# Patient Record
Sex: Female | Born: 1970 | Race: White | Hispanic: No | Smoking: Never smoker
Health system: Southern US, Community
[De-identification: ages and names within clinical notes are randomized; demographics above are authoritative.]

## PROBLEM LIST (undated history)

## (undated) DIAGNOSIS — G43909 Migraine, unspecified, not intractable, without status migrainosus: Secondary | ICD-10-CM

## (undated) DIAGNOSIS — N200 Calculus of kidney: Secondary | ICD-10-CM

## (undated) HISTORY — DX: Migraine, unspecified, not intractable, without status migrainosus: G43.909

## (undated) HISTORY — DX: Calculus of kidney: N20.0

---

## 1999-08-12 ENCOUNTER — Other Ambulatory Visit: Admission: RE | Admit: 1999-08-12 | Discharge: 1999-08-12 | Payer: Self-pay | Admitting: Obstetrics and Gynecology

## 2000-08-17 ENCOUNTER — Other Ambulatory Visit: Admission: RE | Admit: 2000-08-17 | Discharge: 2000-08-17 | Payer: Self-pay | Admitting: Obstetrics and Gynecology

## 2000-12-18 ENCOUNTER — Emergency Department (HOSPITAL_COMMUNITY): Admission: EM | Admit: 2000-12-18 | Discharge: 2000-12-19 | Payer: Self-pay | Admitting: *Deleted

## 2000-12-19 ENCOUNTER — Encounter: Payer: Self-pay | Admitting: *Deleted

## 2000-12-19 ENCOUNTER — Ambulatory Visit (HOSPITAL_COMMUNITY): Admission: RE | Admit: 2000-12-19 | Discharge: 2000-12-19 | Payer: Self-pay | Admitting: *Deleted

## 2001-08-29 ENCOUNTER — Other Ambulatory Visit: Admission: RE | Admit: 2001-08-29 | Discharge: 2001-08-29 | Payer: Self-pay | Admitting: *Deleted

## 2002-09-10 ENCOUNTER — Other Ambulatory Visit: Admission: RE | Admit: 2002-09-10 | Discharge: 2002-09-10 | Payer: Self-pay | Admitting: Obstetrics and Gynecology

## 2003-10-31 ENCOUNTER — Inpatient Hospital Stay (HOSPITAL_COMMUNITY): Admission: AD | Admit: 2003-10-31 | Discharge: 2003-11-02 | Payer: Self-pay | Admitting: Obstetrics and Gynecology

## 2003-12-17 ENCOUNTER — Other Ambulatory Visit: Admission: RE | Admit: 2003-12-17 | Discharge: 2003-12-17 | Payer: Self-pay | Admitting: Obstetrics and Gynecology

## 2004-12-06 ENCOUNTER — Ambulatory Visit: Payer: Self-pay | Admitting: Family Medicine

## 2004-12-14 ENCOUNTER — Ambulatory Visit: Payer: Self-pay | Admitting: Family Medicine

## 2004-12-16 ENCOUNTER — Encounter: Admission: RE | Admit: 2004-12-16 | Discharge: 2004-12-16 | Payer: Self-pay | Admitting: Family Medicine

## 2005-02-01 ENCOUNTER — Other Ambulatory Visit: Admission: RE | Admit: 2005-02-01 | Discharge: 2005-02-01 | Payer: Self-pay | Admitting: Obstetrics and Gynecology

## 2005-05-05 ENCOUNTER — Ambulatory Visit: Payer: Self-pay | Admitting: Internal Medicine

## 2006-05-30 ENCOUNTER — Ambulatory Visit: Payer: Self-pay | Admitting: Family Medicine

## 2006-06-26 ENCOUNTER — Ambulatory Visit: Payer: Self-pay | Admitting: Family Medicine

## 2006-07-24 ENCOUNTER — Ambulatory Visit: Payer: Self-pay | Admitting: Family Medicine

## 2006-08-03 ENCOUNTER — Ambulatory Visit: Payer: Self-pay | Admitting: Family Medicine

## 2006-12-17 ENCOUNTER — Telehealth (INDEPENDENT_AMBULATORY_CARE_PROVIDER_SITE_OTHER): Payer: Self-pay | Admitting: *Deleted

## 2007-01-29 ENCOUNTER — Encounter: Payer: Self-pay | Admitting: Family Medicine

## 2007-02-01 ENCOUNTER — Ambulatory Visit: Payer: Self-pay | Admitting: Family Medicine

## 2007-02-01 ENCOUNTER — Ambulatory Visit: Payer: Self-pay | Admitting: Cardiovascular Disease

## 2007-02-01 ENCOUNTER — Telehealth: Payer: Self-pay | Admitting: Family Medicine

## 2007-02-01 DIAGNOSIS — G43009 Migraine without aura, not intractable, without status migrainosus: Secondary | ICD-10-CM | POA: Insufficient documentation

## 2007-02-01 DIAGNOSIS — L659 Nonscarring hair loss, unspecified: Secondary | ICD-10-CM | POA: Insufficient documentation

## 2007-02-01 IMAGING — CT CT HEAD W/O CM
1 series · 16 of 30 positions shown, 20 images · IV contrast (agent unspecified)
Comparison: MRI [DATE]

CLINICAL DATA: Headache
TECHNIQUE: 5mm collimated images were obtained from the base of the skull
through the vertex according to standard protocol without contrast.

HEAD CT WITHOUT CONTRAST:

[Series 2: head_seq 4.5 h37s st · axial · 0.43mm/px · z∈[-135,-9]mm · 16 of 32 slices shown, 20 images]
[im 2/32  brain]
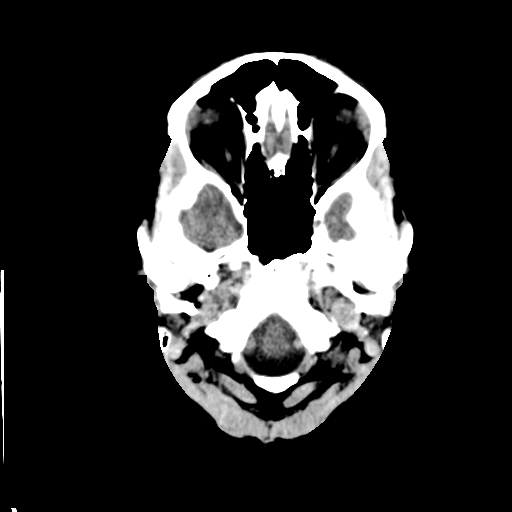
[im 2/32  bone]
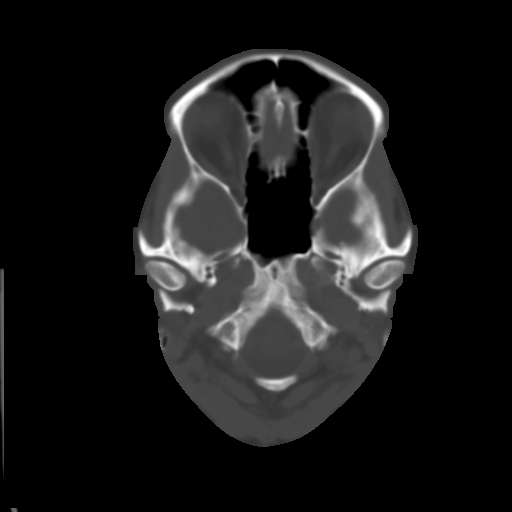
[im 4/32  brain]
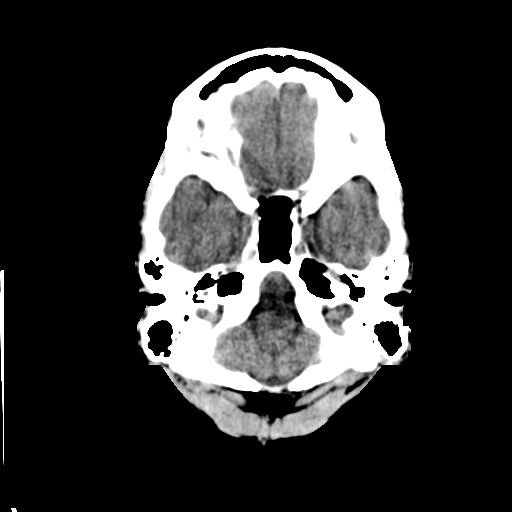
[im 6/32  brain]
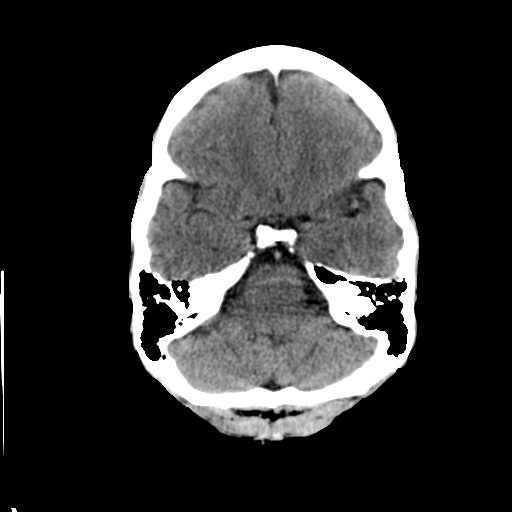
[im 8/32  brain]
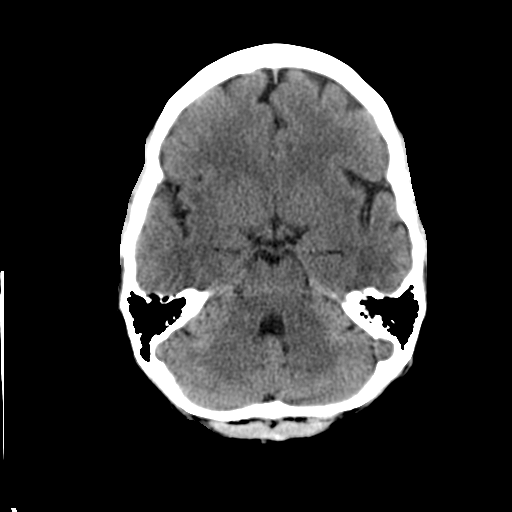
[im 9/32  brain]
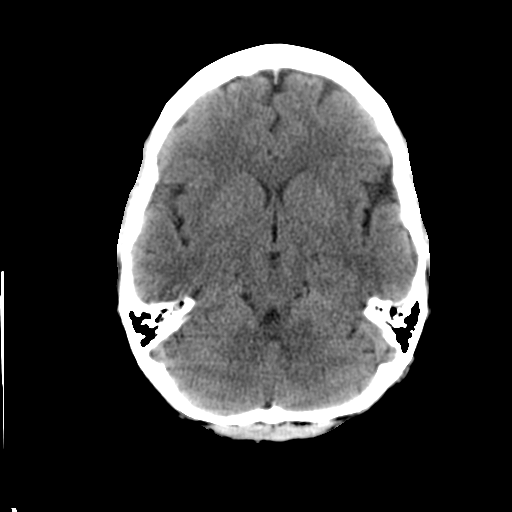
[im 9/32  bone]
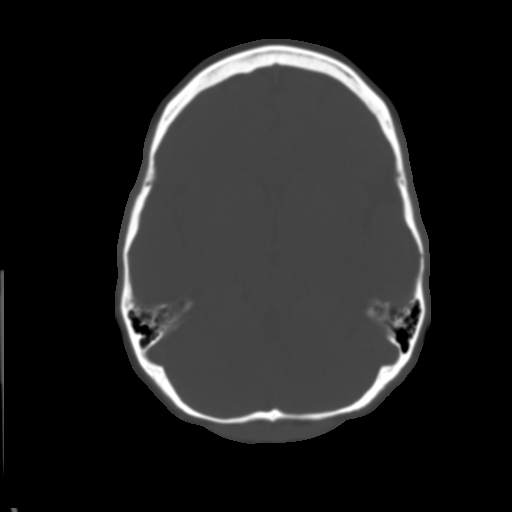
[im 11/32  brain]
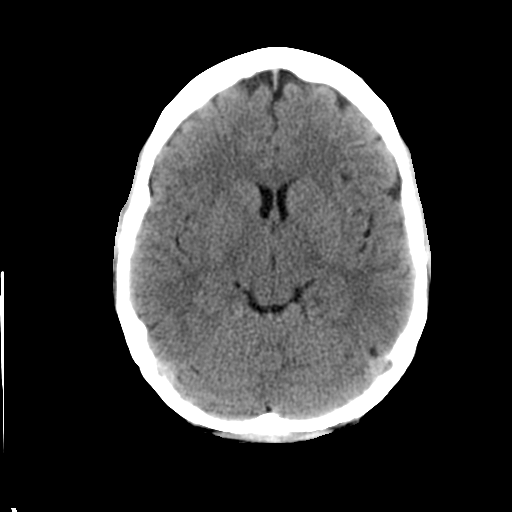
[im 13/32  brain]
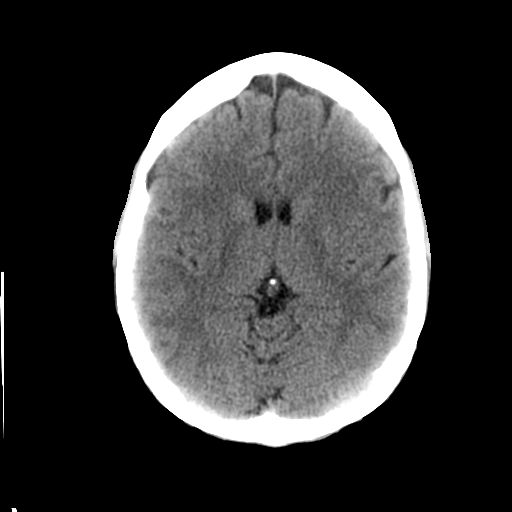
[im 15/32  brain]
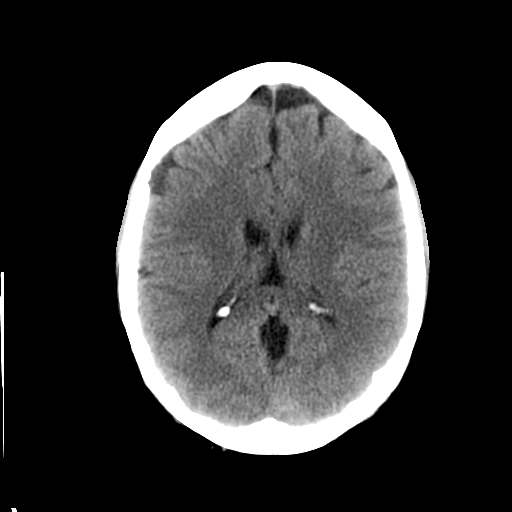
[im 17/32  brain]
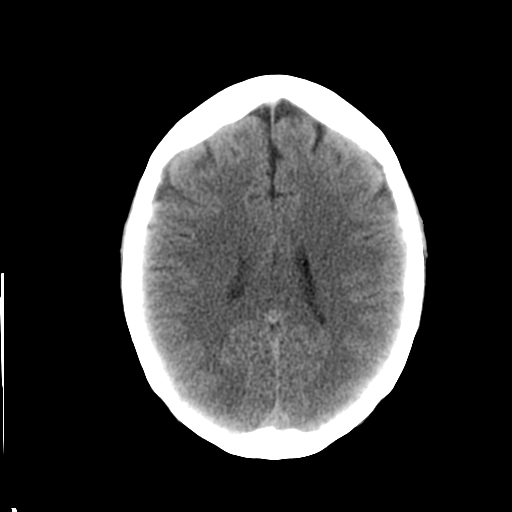
[im 17/32  bone]
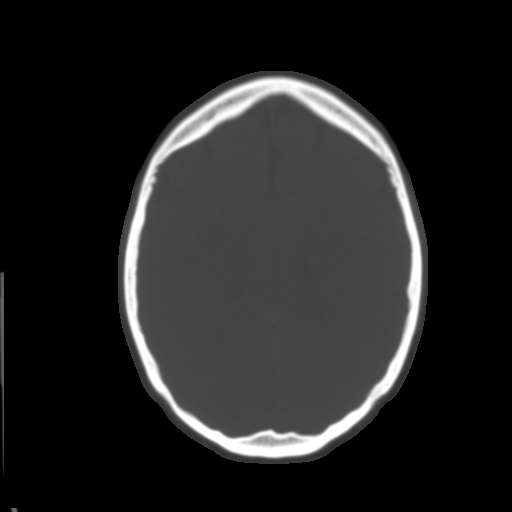
[im 19/32  brain]
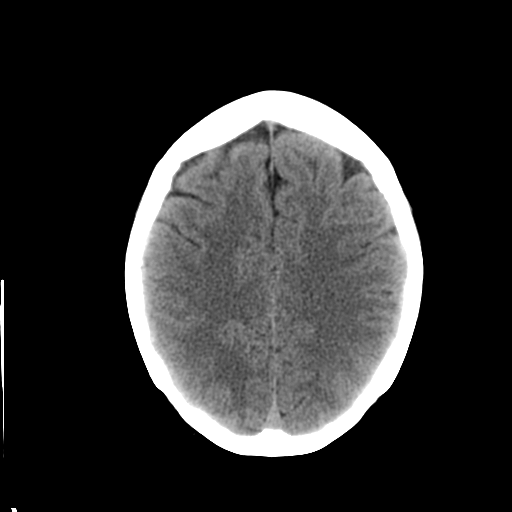
[im 21/32  brain]
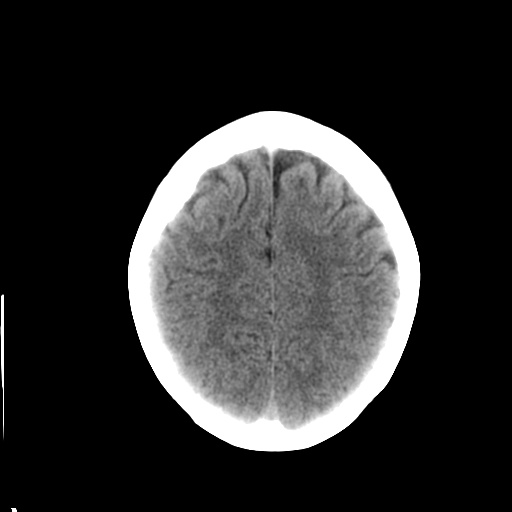
[im 23/32  brain]
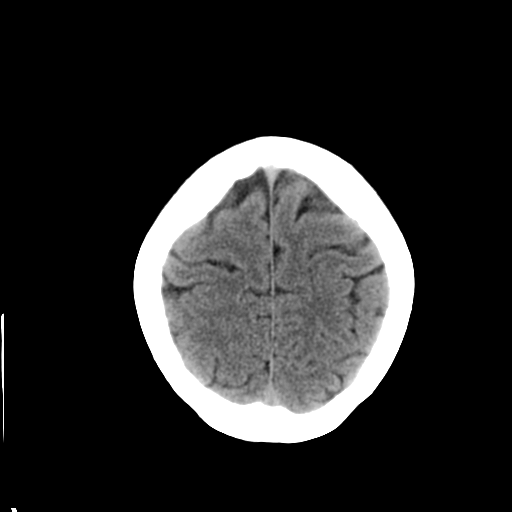
[im 24/32  brain]
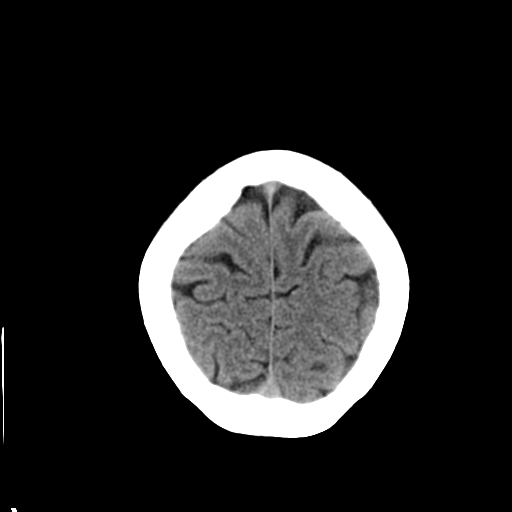
[im 24/32  bone]
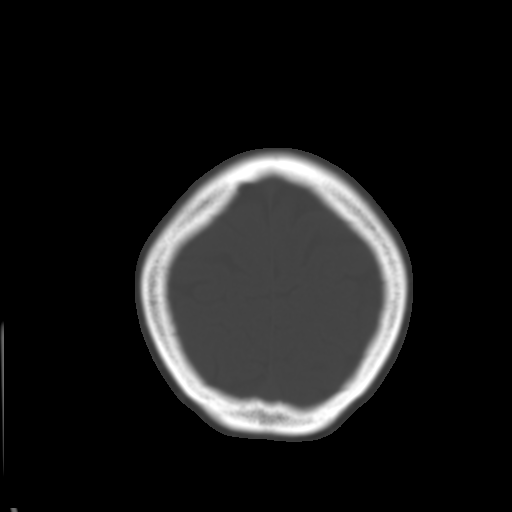
[im 26/32  brain]
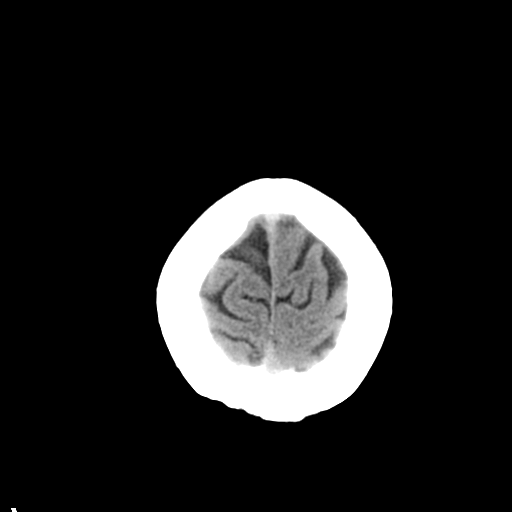
[im 28/32  brain]
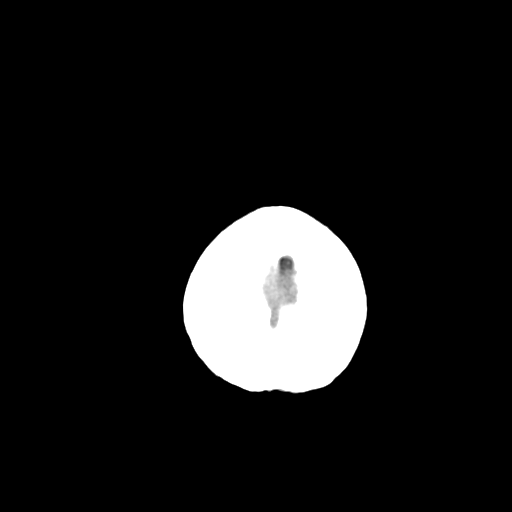
[im 30/32  brain]
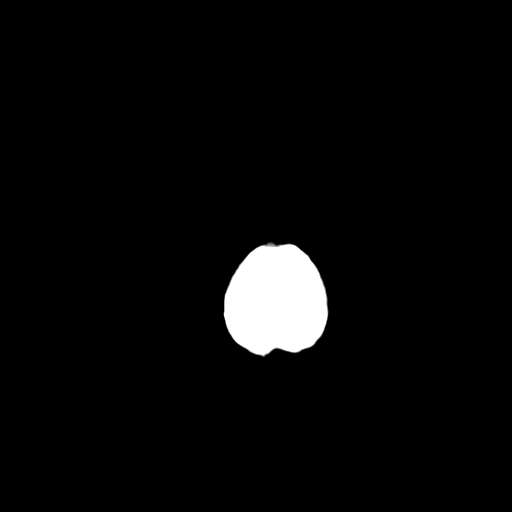

[16 of 30 positions shown; findings below may reference images not displayed]

FINDINGS: There is no evidence for acute hemorrhage, hydrocephalus, mass/mass-effect or
abnormal extra-axial fluid collection.  No definite CT evidence for acute
ischemia.  The visualized paranasal sinuses are clear
IMPRESSION: No acute intracranial abnormality.

## 2007-02-04 LAB — CONVERTED CEMR LAB
BUN: 7 mg/dL (ref 6–23)
CO2: 28 meq/L (ref 19–32)
Calcium: 9.3 mg/dL (ref 8.4–10.5)
Chloride: 103 meq/L (ref 96–112)
Creatinine, Ser: 0.7 mg/dL (ref 0.4–1.2)
Folate: 10.1 ng/mL
GFR calc Af Amer: 122 mL/min
GFR calc non Af Amer: 101 mL/min
Glucose, Bld: 83 mg/dL (ref 70–99)
Potassium: 4.1 meq/L (ref 3.5–5.1)
Sed Rate: 14 mm/hr (ref 0–25)
Sodium: 138 meq/L (ref 135–145)
TSH: 1.58 microintl units/mL (ref 0.35–5.50)
Vitamin B-12: 208 pg/mL — ABNORMAL LOW (ref 211–911)

## 2007-02-12 ENCOUNTER — Encounter (INDEPENDENT_AMBULATORY_CARE_PROVIDER_SITE_OTHER): Payer: Self-pay | Admitting: *Deleted

## 2007-02-19 ENCOUNTER — Encounter: Payer: Self-pay | Admitting: Family Medicine

## 2007-09-09 ENCOUNTER — Telehealth (INDEPENDENT_AMBULATORY_CARE_PROVIDER_SITE_OTHER): Payer: Self-pay | Admitting: *Deleted

## 2009-11-01 ENCOUNTER — Ambulatory Visit: Payer: Self-pay | Admitting: Family Medicine

## 2009-11-01 DIAGNOSIS — N760 Acute vaginitis: Secondary | ICD-10-CM | POA: Insufficient documentation

## 2009-11-01 DIAGNOSIS — N898 Other specified noninflammatory disorders of vagina: Secondary | ICD-10-CM | POA: Insufficient documentation

## 2009-11-01 LAB — CONVERTED CEMR LAB
KOH Prep: NEGATIVE
Whiff Test: POSITIVE

## 2009-11-02 ENCOUNTER — Telehealth (INDEPENDENT_AMBULATORY_CARE_PROVIDER_SITE_OTHER): Payer: Self-pay | Admitting: *Deleted

## 2009-11-02 LAB — CONVERTED CEMR LAB
Chlamydia, DNA Probe: NEGATIVE
GC Probe Amp, Genital: NEGATIVE

## 2010-04-06 ENCOUNTER — Telehealth (INDEPENDENT_AMBULATORY_CARE_PROVIDER_SITE_OTHER): Payer: Self-pay | Admitting: *Deleted

## 2010-04-07 ENCOUNTER — Ambulatory Visit: Payer: Self-pay | Admitting: Family Medicine

## 2010-04-07 DIAGNOSIS — IMO0002 Reserved for concepts with insufficient information to code with codable children: Secondary | ICD-10-CM | POA: Insufficient documentation

## 2010-07-12 NOTE — Progress Notes (Signed)
Summary: lab results  Phone Note Outgoing Call   Call placed by: Army Fossa CMA,  Nov 02, 2009 12:03 PM Reason for Call: Discuss lab or test results Summary of Call: Regarding lab results, LMTCB:  chalymdia,gc: normal  Follow-up for Phone Call        left message to call back. Army Fossa CMA  Nov 03, 2009 10:24 AM  DISCUSS WITH PATIENT....................Marland KitchenFelecia Deloach CMA  Nov 03, 2009 10:30 AM

## 2010-07-12 NOTE — Progress Notes (Signed)
Summary: bug bite? refuse treatment ED/UC  Phone Note Call from Patient Call back at [phone]   Caller: Patient Summary of Call: Patient was pulling weeds last night and noticed a "bug bite"---today it is getting larger, more swollen, very red, itchy---size of half dollar--she says it is on radial side of her right hand near crease at wrist  She says as day progressed, you can see a hole in middle of bite "like a chuck was taken out"  please call [phone] Initial call taken by: Jerolyn Shin,  April 06, 2010 4:27 PM  Follow-up for Phone Call        called pt back, pt states that she is unsure what bite her but bite is increase in size, red and itchy. Advise pt she needs to be seen in ED/UC. Pt insist that she be seen today, advise pt that no appt were available after checking with nurse. Offer pt appt for tomorrow pt refuse stating that she needed to be seen today again advise pt to UC. Pt to call in AM she states if she does not go some where today to be seen. Pt decline to schedule appt at this time. Inform pt that she can try benadryl to help with the symptoms.................Marland KitchenFelecia Deloach CMA  April 06, 2010 4:49 PM   Additional Follow-up for Phone Call Additional follow up Details #1::        please call and check on pt -- we can see her today if she didn't go anywhere Additional Follow-up by: Loreen Freud DO,  April 07, 2010 8:28 AM    Additional Follow-up for Phone Call Additional follow up Details #2::    Left message to call back..... Almeta Monas CMA Duncan Dull)  April 07, 2010 8:47 AM Pt return call stating that she was unable to go to ED/UC on yesterday but would like a appt for today. appt scheduled............Marland KitchenFelecia Deloach CMA  April 07, 2010 12:44 PM

## 2010-07-12 NOTE — Assessment & Plan Note (Signed)
Summary: bug bite//fd   Vital Signs:  Patient profile:   40 year old female Weight:      116 pounds Temp:     98.7 degrees F oral BP sitting:   102 / 60  (left arm)  Vitals Entered By: Doristine Devoid CMA (April 07, 2010 4:27 PM) CC: bug bite? on R wrist xtues. now oozing and redness    History of Present Illness: 40 yo woman here today for bug bite on R wrist.  1st appeared on Tues.  yesterday was small and itchy.  today is large, red, warm.  still very itchy.  now has clear blisters in the center w/ 'oozing'.  no fevers.  redness is spreading- line drawn yesterday afternoon.  took benadryl last night.  was pulling weeds Tuesday.  also has rose bushes.  Allergies (verified): 1)  ! Stadol  Review of Systems      See HPI  Physical Exam  General:  Well-developed,well-nourished,in no acute distress; alert,appropriate and cooperative throughout examination Skin:  R wrist w/ 5 cm area of erythema, mildly warm, no induration w/ central area w/ vesicles, some oozing of clear fluid.  not painful.  + itchy.  similar smaller area higher on forearm   Impression & Recommendations:  Problem # 1:  CELLULITIS/ABSCESS, ARM (ICD-682.3) Assessment New pt w/ apparent cellulitis on the wrist.  ? whether this is actually poison ivy rather than bug bite given multiple areas and presence of vesicles.  start keflex for infxn and steroid cream for itching.  reviewed supportive care and red flags that should prompt return.  Pt expresses understanding and is in agreement w/ this plan. Her updated medication list for this problem includes:    Cephalexin 500 Mg Tabs (Cephalexin) .Marland Kitchen... Take one by mouth 2 times daily x7 days  Complete Medication List: 1)  Seasonale 0.15-0.03 Mg Tabs (Levonorgest-eth estrad 91-day) 2)  Cephalexin 500 Mg Tabs (Cephalexin) .... Take one by mouth 2 times daily x7 days 3)  Triamcinolone Acetonide 0.1 % Oint (Triamcinolone acetonide) .... Apply to affected area two times a  day.  disp 1 large tube  Patient Instructions: 1)  Take the Cephalexin as directed for the cellulitis (infection) 2)  Use the triamcinolone cream two times a day for itching 3)  Continue the benadryl as needed 4)  Wash your sheets and towels and anything that may have come in contact w/ the poison ivy 5)  If the redness continues to spread or you have other concerns- call! 6)  Hang in there!! Prescriptions: TRIAMCINOLONE ACETONIDE 0.1 % OINT (TRIAMCINOLONE ACETONIDE) apply to affected area two times a day.  disp 1 large tube  #1 x 1   Entered and Authorized by:   Neena Rhymes MD   Signed by:   Neena Rhymes MD on 04/07/2010   Method used:   Electronically to        [facility] # [phone]* (retail)       [address] [place], [place]  [address]       Ph: [phone]       Fax: [phone]   RxID:   [id number] CEPHALEXIN 500 MG  TABS (CEPHALEXIN) take one by mouth 2 times daily x7 days  #14 x 0   Entered and Authorized by:   Neena Rhymes MD   Signed by:   Neena Rhymes MD on 04/07/2010   Method used:  Electronically to        [facility] # [id number]* (retail)       [address] [place], [place]  [address]       Ph: [phone]       Fax: [phone]   RxID:   [id number]    Orders Added: 1)  Est. Patient Level III [phone]

## 2010-07-12 NOTE — Assessment & Plan Note (Signed)
Summary: vaginal odor & discharge.cbs   Vital Signs:  Patient profile:   40 year old female Height:      62 inches Weight:      117 pounds BMI:     21.48 Pulse rate:   78 / minute Pulse rhythm:   regular BP sitting:   117 / 78  (left arm) Cuff size:   regular  Vitals Entered By: Army Fossa CMA (Nov 01, 2009 11:15 AM) CC: Pt here for vaginal dischage and odor. Pt used restroom before she came in. , Vaginal discharge   History of Present Illness:  Vaginal discharge      This is a 40 year old woman who presents with Vaginal discharge.  The symptoms began 5 days ago.  The patient complains of itching and vaginal burning, but denies burning on urination, frequency, urgency, fever, pelvic pain, and back pain.  The discharge is described as white and malodorous.  The patient denies the following symptoms: genital sores, unusual vaginal bleeding, painful intercourse, rash, myalgias, arthralgias, and headache.  Prior treatment has included no prior treatment.    Allergies: 1)  ! Stadol (Butorphanol Tartrate)  Physical Exam  General:  Well-developed,well-nourished,in no acute distress; alert,appropriate and cooperative throughout examination Abdomen:  Bowel sounds positive,abdomen soft and non-tender without masses, organomegaly or hernias noted. Genitalia:  normal introitus, no external lesions, no vaginal or cervical lesions, and vaginal discharge.   Psych:  Cognition and judgment appear intact. Alert and cooperative with normal attention span and concentration. No apparent delusions, illusions, hallucinations   Impression & Recommendations:  Problem # 1:  VAGINAL DISCHARGE (ICD-623.5)  Orders: T-Chlamydia Probe, genital (13086-57846) T-GC Probe, genital (96295-28413) Wet Prep (24401UU) Urine Pregnancy Test  (72536) UA Dipstick w/o Micro (manual) (64403)  Discussed medication use and symptom relief.   Problem # 2:  BACTERIAL VAGINITIS (ICD-616.10)  Her updated medication  list for this problem includes:    Flagyl 500 Mg Tabs (Metronidazole) .Marland Kitchen... 1 by mouth two times a day  Orders: Urine Pregnancy Test  (47425) UA Dipstick w/o Micro (manual) (95638)  Discussed symptomatic relief and treatment options.   Complete Medication List: 1)  Seasonale 0.15-0.03 Mg Tabs (Levonorgest-eth estrad 91-day) 2)  Flagyl 500 Mg Tabs (Metronidazole) .Marland Kitchen.. 1 by mouth two times a day Prescriptions: FLAGYL 500 MG TABS (METRONIDAZOLE) 1 by mouth two times a day  #14 x 0   Entered and Authorized by:   Loreen Freud DO   Signed by:   Loreen Freud DO on 11/01/2009   Method used:   Electronically to        [facility] # [id number]* (retail)       [address] [place], [place]  [address]       Ph: [phone]       Fax: [phone]   RxID:   [id number]   Laboratory Results  Date/Time Received: Nov 01, 2009 11:36 AM  Date/Time Reported: Nov 01, 2009 11:36 AM   [employer] Source: vaginal WBC/hpf: 5-10 Bacteria/hpf: 1+ Clue cells/hpf: few  Positive whiff Yeast/hpf: none Wet [place]: Negative Trichomonas/hpf: none

## 2010-11-25 ENCOUNTER — Ambulatory Visit (INDEPENDENT_AMBULATORY_CARE_PROVIDER_SITE_OTHER): Payer: BC Managed Care – PPO | Admitting: Internal Medicine

## 2010-11-25 ENCOUNTER — Encounter: Payer: Self-pay | Admitting: Internal Medicine

## 2010-11-25 VITALS — BP 110/70 | HR 64 | Temp 98.3°F | Wt 117.0 lb

## 2010-11-25 DIAGNOSIS — J069 Acute upper respiratory infection, unspecified: Secondary | ICD-10-CM

## 2010-11-25 DIAGNOSIS — J029 Acute pharyngitis, unspecified: Secondary | ICD-10-CM

## 2010-11-25 LAB — POCT RAPID STREP A (OFFICE): Rapid Strep A Screen: NEGATIVE

## 2010-11-25 MED ORDER — AMOXICILLIN 500 MG PO CAPS: 1000.0000 mg | ORAL_CAPSULE | Freq: Two times a day (BID) | ORAL | Status: AC

## 2010-11-25 NOTE — Patient Instructions (Signed)
Rest, fluids , tylenol For cough, take Mucinex DM twice a day as needed  Take the antibiotic (amoxicillin), only if no better in 3 -4 days  Call if no better in 1 week Call anytime if the symptoms are severe, you have high fever, short of breath,  more ear pain

## 2010-11-25 NOTE — Progress Notes (Signed)
  Subjective:    Patient ID: Lynn Olsen, female    DOB: [birth year], 40 y.o.   MRN: [id number]  HPI Symptoms started 3 days ago with sore throat, fever up to 100, achy, today she developed a right ear ache.  Past Medical History  Diagnosis Date  . Migraines   . Kidney stone     litotrip. 2008   No past surgical history on file.   Review of Systems Denies any rash Some cough without chest congestion. Sinuses are congested with some nasal bleeding or discharge No nausea or vomiting      Objective:   Physical Exam  Constitutional: She appears well-developed and well-nourished. No distress.  HENT:  Head: Normocephalic and atraumatic.  Right Ear: External ear normal.  Left Ear: External ear normal.       Percussion of the mastoid area normal bilaterally. Throat without redness or discharge, absent tonsils  Neck: Normal range of motion. Neck supple.       1 lymphadenopathy on the left, less than 1 cm, nontender  Cardiovascular: Normal rate, regular rhythm and normal heart sounds.   Pulmonary/Chest: Breath sounds normal. No respiratory distress. She has no wheezes. She has no rales.  Skin: She is not diaphoretic.          Assessment & Plan:

## 2010-11-25 NOTE — Assessment & Plan Note (Addendum)
Symptoms consistent with a URI, rapid strep test negative, she has right ear pain without evidence of infection on physical exam. Plan: See instructions

## 2011-12-06 ENCOUNTER — Encounter: Payer: Self-pay | Admitting: Family Medicine

## 2011-12-06 ENCOUNTER — Ambulatory Visit (INDEPENDENT_AMBULATORY_CARE_PROVIDER_SITE_OTHER): Payer: BC Managed Care – PPO | Admitting: Family Medicine

## 2011-12-06 ENCOUNTER — Telehealth: Payer: Self-pay

## 2011-12-06 VITALS — BP 100/74 | HR 77 | Temp 98.4°F | Ht 62.0 in | Wt 108.0 lb

## 2011-12-06 DIAGNOSIS — F411 Generalized anxiety disorder: Secondary | ICD-10-CM

## 2011-12-06 DIAGNOSIS — R079 Chest pain, unspecified: Secondary | ICD-10-CM

## 2011-12-06 DIAGNOSIS — F419 Anxiety disorder, unspecified: Secondary | ICD-10-CM

## 2011-12-06 LAB — CBC WITH DIFFERENTIAL/PLATELET
Eosinophils Relative: 0.7 % (ref 0.0–5.0)
Lymphocytes Relative: 18.8 % (ref 12.0–46.0)
Monocytes Relative: 4.7 % (ref 3.0–12.0)
Neutrophils Relative %: 75.1 % (ref 43.0–77.0)
Platelets: 216 10*3/uL (ref 150.0–400.0)
WBC: 6.8 10*3/uL (ref 4.5–10.5)

## 2011-12-06 LAB — TSH: TSH: 0.76 u[IU]/mL (ref 0.35–5.50)

## 2011-12-06 LAB — HEPATIC FUNCTION PANEL
ALT: 17 U/L (ref 0–35)
AST: 17 U/L (ref 0–37)
Albumin: 3.9 g/dL (ref 3.5–5.2)
Alkaline Phosphatase: 33 U/L — ABNORMAL LOW (ref 39–117)
Bilirubin, Direct: 0.1 mg/dL (ref 0.0–0.3)
Total Bilirubin: 0.3 mg/dL (ref 0.3–1.2)

## 2011-12-06 LAB — BASIC METABOLIC PANEL
BUN: 8 mg/dL (ref 6–23)
Creatinine, Ser: 0.6 mg/dL (ref 0.4–1.2)
GFR: 120.02 mL/min (ref >60.00)

## 2011-12-06 MED ORDER — ESCITALOPRAM OXALATE 10 MG PO TABS: ORAL_TABLET | ORAL | Status: DC

## 2011-12-06 MED ORDER — ALPRAZOLAM 0.25 MG PO TABS: 0.2500 mg | ORAL_TABLET | Freq: Three times a day (TID) | ORAL | Status: DC | PRN

## 2011-12-06 NOTE — Telephone Encounter (Signed)
Call from patient and she stated she had been having some chest pain and elevated BP since yesterday, No sob, no radaiting pain, no other symptoms but she feels a little anxious. OK to see the patient today. Patent has ben scheduled for 1130 but I advised her to come in now and we will work her in as the schedule permits. Patient is ok with that.     KP

## 2011-12-06 NOTE — Progress Notes (Signed)
  Subjective:    Lynn Olsen is a 41 y.o. female who presents for evaluation of chest pain. Onset was 1 day ago. Symptoms have worsened since that time. The patient describes the pain as heaviness, pressure and does not radiate. Patient rates pain as a 7/10 in intensity. Associated symptoms are: none. Aggravating factors are: emotional stress. Alleviating factors are: position change.-- when she lies down she thinks about things and pain is worse.  When she is distracted it goes away.   Patient's cardiac risk factors are: family history of premature cardiovascular disease. Patient's risk factors for DVT/PE: oral contraceptive use. Previous cardiac testing: electrocardiogram (ECG).  The following portions of the patient's history were reviewed and updated as appropriate: allergies, current medications, past family history, past medical history, past social history, past surgical history and problem list.  Review of Systems Pertinent items are noted in HPI.    Objective:    BP 100/74  Pulse 77  Temp 98.4 F (36.9 C) (Oral)  Ht 5\' 2"  (1.575 m)  Wt 108 lb (48.988 kg)  BMI 19.75 kg/m2  SpO2 97% General appearance: alert, cooperative, appears stated age and no distress Neck: no adenopathy, no carotid bruit, no JVD, supple, symmetrical, trachea midline and thyroid not enlarged, symmetric, no tenderness/mass/nodules Lungs: clear to auscultation bilaterally Heart: regular rate and rhythm, S1, S2 normal, no murmur, click, rub or gallop Extremities: extremities normal, atraumatic, no cyanosis or edema Psych--no depression, no anxiety  Cardiographics ECG: normal sinus rhythm, no blocks or conduction defects, no ischemic changes  Imaging Chest x-ray: not indicated    Assessment:    Chest pain, suspected etiology: anxiety, panic attack    Plan:    Patient history and exam consistent with non-cardiac cause of chest pain. Follow up with me in 1 month. lexapro and xanax  Labs drawn rto  sooner prn

## 2011-12-06 NOTE — Patient Instructions (Signed)

## 2011-12-06 NOTE — Telephone Encounter (Signed)
Caller: Lexine/Patient; PCP: Lelon Perla.; CB#: [phone]; ; ; Call regarding Chest Pain/Chest Discomfort;  Was in disconnect when got call. Called back x 2. Pt states she has talked with Dr. Layla Maw who advised her to come on the office. Pt states "oh, I'm fine" when asked if ok to drive. Advised pt to pull over and call 911 at anytime if emergent sxs arose on the way. Pt did not sound to be in any disrtress.

## 2011-12-21 ENCOUNTER — Telehealth: Payer: Self-pay | Admitting: Family Medicine

## 2011-12-21 MED ORDER — ALPRAZOLAM 0.5 MG PO TABS: 0.5000 mg | ORAL_TABLET | Freq: Three times a day (TID) | ORAL | Status: DC | PRN

## 2011-12-21 NOTE — Telephone Encounter (Signed)
Xanax 0.5 mg  Tid #60  --- but that is the strongest I will write

## 2011-12-21 NOTE — Telephone Encounter (Signed)
Patient called and stated xanax not doing what she thought it would. Pt. Mentioned she has a strong tolerance to medications & would like a stronger dose. If approved can you send to  [id number] - [place], [place] - [address] AT [facility] [address]  Patient call back# [phone]

## 2011-12-21 NOTE — Telephone Encounter (Signed)
discussed with patient and she voiced understanding. Rx faxed      KP 

## 2012-01-05 ENCOUNTER — Ambulatory Visit (INDEPENDENT_AMBULATORY_CARE_PROVIDER_SITE_OTHER): Payer: BC Managed Care – PPO | Admitting: Family Medicine

## 2012-01-05 ENCOUNTER — Encounter: Payer: Self-pay | Admitting: Family Medicine

## 2012-01-05 VITALS — BP 110/70 | HR 73 | Temp 97.1°F | Wt 109.8 lb

## 2012-01-05 DIAGNOSIS — F419 Anxiety disorder, unspecified: Secondary | ICD-10-CM

## 2012-01-05 DIAGNOSIS — F411 Generalized anxiety disorder: Secondary | ICD-10-CM

## 2012-01-05 NOTE — Progress Notes (Signed)
  Subjective:    Patient ID: Lynn Olsen, female    DOB: May 26, 1971, 41 y.o.   MRN: [id number]  HPI  Pt here to f/u anxiety. She has had no more chest pain and overall is doing well with medication.   No new complaints.  Review of Systems As above    Objective:   Physical Exam  Constitutional: She is oriented to person, place, and time. She appears well-developed and well-nourished.  Neurological: She is alert and oriented to person, place, and time.  Psychiatric: She has a normal mood and affect. Her behavior is normal. Judgment and thought content normal.          Assessment & Plan:

## 2012-01-05 NOTE — Assessment & Plan Note (Signed)
con't lexapro-- she thinks she may want to increase to 20 mg  She will call us if she wants [place] to change the dose Cont' xanax qhs prn

## 2012-01-05 NOTE — Patient Instructions (Signed)

## 2012-01-16 ENCOUNTER — Telehealth: Payer: Self-pay | Admitting: *Deleted

## 2012-01-16 NOTE — Telephone Encounter (Signed)
Remind her to be very careful with xanax---  She can decrease lexapro but she has only been on inc dose for a little over a week---right?  If lexapro is not helping we may need to try something different because the idea was to decrease xanax not increase,

## 2012-01-16 NOTE — Telephone Encounter (Signed)
Discussed with patient and she is having issues with Insomnia, feel like the increased dose of Lexapro made it worst.  she stated she will cut back on the Lexapro and see if she can sleep better on the 10 mg and she will call us back to let us know.  Patient wanted a refill on the xanax last filled 12/21/11 # 60. Please advise    KP

## 2012-01-16 NOTE — Telephone Encounter (Signed)
Pt left VM that she has increased dose of med but is still having issue with sleeping. Pt notes that she is taking lexapro 10mg  2 tabs at night but it has seem to make her sleeping worse. Pt would like to know if she should decrease back down to the 1 tab. Pt also indicated that the xanax is working and needs a refill.Please advise

## 2012-01-17 MED ORDER — ALPRAZOLAM 0.5 MG PO TABS: 0.5000 mg | ORAL_TABLET | Freq: Three times a day (TID) | ORAL | Status: AC | PRN

## 2012-01-17 NOTE — Telephone Encounter (Signed)
Ok to refill x 1  

## 2012-02-02 ENCOUNTER — Telehealth: Payer: Self-pay

## 2012-02-02 NOTE — Telephone Encounter (Signed)
msg left to call the office     KP 

## 2012-02-02 NOTE — Telephone Encounter (Signed)
Message left on triage voicemail: taking lexapro x 2 months, with-in the last couple of weeks patient noticed arms and legs achy (not sure if related). Patient describe achyness as (arthritis type pain). Patient reviewed package insert but did not see this noted as a side effect.   Dr.Lowne please advise

## 2012-02-02 NOTE — Telephone Encounter (Signed)
Its not a side effect that I know about or is listed on side effects.

## 2012-02-07 NOTE — Telephone Encounter (Signed)
msg left to call the office     KP 

## 2012-02-09 NOTE — Telephone Encounter (Signed)
Discussed with patient and she will stop the lexapro for a few days and will call back on Monday to see if there is any improvement.    KP

## 2012-03-21 ENCOUNTER — Ambulatory Visit (INDEPENDENT_AMBULATORY_CARE_PROVIDER_SITE_OTHER): Payer: BC Managed Care – PPO | Admitting: Family Medicine

## 2012-03-21 ENCOUNTER — Encounter: Payer: Self-pay | Admitting: Family Medicine

## 2012-03-21 VITALS — BP 110/76 | HR 74 | Temp 98.4°F | Wt 109.8 lb

## 2012-03-21 DIAGNOSIS — F411 Generalized anxiety disorder: Secondary | ICD-10-CM

## 2012-03-21 DIAGNOSIS — F419 Anxiety disorder, unspecified: Secondary | ICD-10-CM

## 2012-03-21 MED ORDER — FLUOXETINE HCL 20 MG PO TABS: 20.0000 mg | ORAL_TABLET | Freq: Every day | ORAL | Status: DC

## 2012-03-21 MED ORDER — ALPRAZOLAM 0.5 MG PO TABS: 0.5000 mg | ORAL_TABLET | Freq: Every day | ORAL | Status: DC

## 2012-03-21 NOTE — Patient Instructions (Addendum)

## 2012-03-21 NOTE — Progress Notes (Signed)
  Subjective:    Patient ID: Lynn Olsen, female    DOB: 03-02-1971, 41 y.o.   MRN: [id number]  HPI Pt here   Review of Systems     Objective:   Physical Exam        Assessment & Plan:   Subjective:     Lynn Olsen is a 41 y.o. female who presents for follow up of anxiety disorder and panic attacks. She has the following anxiety symptoms: difficulty concentrating, insomnia and panic attacks. Onset of symptoms was approximately several months ago. Symptoms have been gradually worsening since that time. She denies current suicidal and homicidal ideation. Family history significant for no psychiatric illness. Risk factors: negative life event separation from husband and death of father as well as putting mother in nusing home. Previous treatment includes Lexapro and Xanax. She complains of the following medication side effects: nervousness. The following portions of the patient's history were reviewed and updated as appropriate: allergies, current medications, past family history, past medical history, past social history, past surgical history and problem list.  Review of Systems Pertinent items are noted in HPI.    Objective:    BP 110/76  Pulse 74  Temp 98.4 F (36.9 C) (Oral)  Wt 109 lb 12.8 oz (49.805 kg)  SpO2 98% General appearance: alert, cooperative, appears stated age and no distress Lungs: clear to auscultation bilaterally Heart: S1, S2 normal Lymph nodes: Cervical, supraclavicular, and axillary nodes normal. Neurologic: Alert and oriented X 3, normal strength and tone. Normal symmetric reflexes. Normal coordination and gait    Assessment:    anxiety disorder and panic attacks. Possible organic contributing causes are: none.   Plan:    Medications: Prozac and Xanax. Labs: no labs indicated at this time. Recommended counseling. List of counselors provided. Recommended transfer to psychiatry for medical management. List of Psychiatrists  recommended. Handouts describing disease, natural history, and treatment were given to the patient. Instructed patient to contact office or on-call physician promptly should condition worsen or any new symptoms appear and provided on-call telephone numbers. IF THE PATIENT HAS ANY SUICIDAL OR HOMICIDAL IDEATIONS, CALL THE OFFICE, DISCUSS WITH A SUPPORT MEMBER, OR GO TO THE ER IMMEDIATELY. Patient was agreeable with this plan. Follow up: 1 month.

## 2012-04-18 ENCOUNTER — Other Ambulatory Visit: Payer: Self-pay | Admitting: Family Medicine

## 2012-04-19 NOTE — Telephone Encounter (Signed)
Spoke with the pharmacy and was made aware that that the patient has another standing Rx with refills. I will cancel this request and he will fill the other from 03/21/12.     KP

## 2012-07-05 ENCOUNTER — Encounter: Payer: Self-pay | Admitting: *Deleted

## 2012-07-05 ENCOUNTER — Ambulatory Visit (INDEPENDENT_AMBULATORY_CARE_PROVIDER_SITE_OTHER): Payer: BC Managed Care – PPO | Admitting: *Deleted

## 2012-07-05 DIAGNOSIS — Z23 Encounter for immunization: Secondary | ICD-10-CM

## 2012-07-20 ENCOUNTER — Other Ambulatory Visit: Payer: Self-pay | Admitting: Family Medicine

## 2012-10-13 ENCOUNTER — Other Ambulatory Visit: Payer: Self-pay | Admitting: Family Medicine

## 2012-10-14 NOTE — Telephone Encounter (Signed)
Last seen and filled 03/21/12 # 60 with 5 refills. Please advise     KP

## 2012-10-23 ENCOUNTER — Telehealth: Payer: Self-pay | Admitting: *Deleted

## 2012-10-23 MED ORDER — FLUOXETINE HCL 20 MG PO TABS: ORAL_TABLET | ORAL | Status: DC

## 2012-10-23 NOTE — Telephone Encounter (Signed)
last OV 03-21-12, last filled 07-20-12 #30 3

## 2012-10-23 NOTE — Telephone Encounter (Signed)
Ok to refill until October

## 2012-12-13 ENCOUNTER — Other Ambulatory Visit: Payer: Self-pay | Admitting: Family Medicine

## 2012-12-16 NOTE — Telephone Encounter (Signed)
Last seen 03/21/12 and filled 10/13/12 #60. Please advise    KP

## 2012-12-20 ENCOUNTER — Encounter: Payer: Self-pay | Admitting: Lab

## 2012-12-23 ENCOUNTER — Encounter: Payer: Self-pay | Admitting: Family Medicine

## 2012-12-23 ENCOUNTER — Ambulatory Visit (INDEPENDENT_AMBULATORY_CARE_PROVIDER_SITE_OTHER): Payer: BC Managed Care – PPO | Admitting: Family Medicine

## 2012-12-23 VITALS — BP 116/70 | HR 88 | Temp 98.6°F | Ht 62.0 in | Wt 117.2 lb

## 2012-12-23 DIAGNOSIS — F411 Generalized anxiety disorder: Secondary | ICD-10-CM

## 2012-12-23 DIAGNOSIS — Z0000 Encounter for general adult medical examination without abnormal findings: Secondary | ICD-10-CM

## 2012-12-23 DIAGNOSIS — N390 Urinary tract infection, site not specified: Secondary | ICD-10-CM

## 2012-12-23 LAB — CBC WITH DIFFERENTIAL/PLATELET
Basophils Absolute: 0.1 10*3/uL (ref 0.0–0.1)
HCT: 41.8 % (ref 36.0–46.0)
Hemoglobin: 13.8 g/dL (ref 12.0–15.0)
Lymphs Abs: 1.5 10*3/uL (ref 0.7–4.0)
MCV: 96.8 fl (ref 78.0–100.0)
Monocytes Relative: 6.6 % (ref 3.0–12.0)
Neutro Abs: 3.5 10*3/uL (ref 1.4–7.7)
RDW: 14.2 % (ref 11.5–14.6)

## 2012-12-23 LAB — BASIC METABOLIC PANEL
CO2: 26 mEq/L (ref 19–32)
Chloride: 104 mEq/L (ref 96–112)
GFR: 96.42 mL/min (ref >60.00)
Glucose, Bld: 85 mg/dL (ref 70–99)
Potassium: 4.4 mEq/L (ref 3.5–5.1)
Sodium: 136 mEq/L (ref 135–145)

## 2012-12-23 LAB — HEPATIC FUNCTION PANEL
ALT: 14 U/L (ref 0–35)
AST: 19 U/L (ref 0–37)
Albumin: 4 g/dL (ref 3.5–5.2)

## 2012-12-23 LAB — POCT URINALYSIS DIPSTICK
Bilirubin, UA: NEGATIVE
Glucose, UA: NEGATIVE
Spec Grav, UA: 1.02

## 2012-12-23 LAB — LIPID PANEL
Cholesterol: 209 mg/dL — ABNORMAL HIGH (ref 0–200)
VLDL: 17.6 mg/dL (ref 0.0–40.0)

## 2012-12-23 LAB — LDL CHOLESTEROL, DIRECT: Direct LDL: 137 mg/dL

## 2012-12-23 MED ORDER — FLUOXETINE HCL 40 MG PO CAPS: 40.0000 mg | ORAL_CAPSULE | Freq: Every day | ORAL | Status: DC

## 2012-12-23 NOTE — Progress Notes (Signed)
  Subjective:     Lynn Olsen is a 42 y.o. female and is here for a comprehensive physical exam. The patient reports no problems.  Pt would like labs to Dr Marcelle Overlie  History   Social History  . Marital Status: Married    Spouse Name: N/A    Number of Children: N/A  . Years of Education: N/A   Occupational History  . pt-- [employer]    Social History Main Topics  . Smoking status: Never Smoker   . Smokeless tobacco: Never Used  . Alcohol Use: Yes     Comment: socially   . Drug Use: No  . Sexually Active: Not Currently -- Female partner(s)   Other Topics Concern  . Not on file   Social History Narrative   Exercise-- 5x a week   Health Maintenance  Topic Date Due  . Mammogram  12/25/1989  . Tetanus/tdap  12/26/1990  . Influenza Vaccine  02/10/2013  . Pap Smear  11/25/2015    The following portions of the patient's history were reviewed and updated as appropriate: allergies, current medications, past family history, past medical history, past social history, past surgical history and problem list.  Review of Systems Review of Systems  Constitutional: Negative for activity change, appetite change and fatigue.  HENT: Negative for hearing loss, congestion, tinnitus and ear discharge.  dentist q70m Eyes: Negative for visual disturbance (see optho q1y -- vision corrected to 20/20 with contacts).  Respiratory: Negative for cough, chest tightness and shortness of breath.   Cardiovascular: Negative for chest pain, palpitations and leg swelling.  Gastrointestinal: Negative for abdominal pain, diarrhea, constipation and abdominal distention.  Genitourinary: Negative for urgency, frequency, decreased urine volume and difficulty urinating.  Musculoskeletal: Negative for back pain, arthralgias and gait problem.  Skin: Negative for color change, pallor and rash.  Neurological: Negative for dizziness, light-headedness, numbness and headaches.  Hematological: Negative for  adenopathy. Does not bruise/bleed easily.  Psychiatric/Behavioral: Negative for suicidal ideas, confusion, sleep disturbance, self-injury, dysphoric mood, decreased concentration and agitation.       Objective:    BP 116/70  Pulse 88  Temp(Src) 98.6 F (37 C) (Oral)  Ht 5\' 2"  (1.575 m)  Wt 117 lb 3.2 oz (53.162 kg)  BMI 21.43 kg/m2  SpO2 95% General appearance: alert, cooperative, appears stated age and no distress Head: Normocephalic, without obvious abnormality, atraumatic Eyes: conjunctivae/corneas clear. PERRL, EOM's intact. Fundi benign. Ears: normal TM's and external ear canals both ears Nose: Nares normal. Septum midline. Mucosa normal. No drainage or sinus tenderness. Throat: lips, mucosa, and tongue normal; teeth and gums normal Neck: no adenopathy, supple, symmetrical, trachea midline and thyroid not enlarged, symmetric, no tenderness/mass/nodules Back: symmetric, no curvature. ROM normal. No CVA tenderness. Lungs: clear to auscultation bilaterally Breasts: gyn Heart: regular rate and rhythm, S1, S2 normal, no murmur, click, rub or gallop Abdomen: soft, non-tender; bowel sounds normal; no masses,  no organomegaly Pelvic: deferred --gyn Extremities: extremities normal, atraumatic, no cyanosis or edema Pulses: 2+ and symmetric Skin: Skin color, texture, turgor normal. No rashes or lesions Lymph nodes: Cervical, supraclavicular, and axillary nodes normal. Neurologic: Alert and oriented X 3, normal strength and tone. Normal symmetric reflexes. Normal coordination and gait   psych-- no anxiety, no depression   Assessment:    Healthy female exam.     Plan:     ghm utd Check labs See After Visit Summary for Counseling Recommendations

## 2012-12-23 NOTE — Addendum Note (Signed)
Addended by: Silvio Pate D on: 12/23/2012 05:08 PM   Modules accepted: Orders

## 2012-12-23 NOTE — Patient Instructions (Signed)
Preventive Care for Adults, Female A healthy lifestyle and preventive care can promote health and wellness. Preventive health guidelines for women include the following key practices.  A routine yearly physical is a good way to check with your caregiver about your health and preventive screening. It is a chance to share any concerns and updates on your health, and to receive a thorough exam.  Visit your dentist for a routine exam and preventive care every 6 months. Brush your teeth twice a day and floss once a day. Good oral hygiene prevents tooth decay and gum disease.  The frequency of eye exams is based on your age, health, family medical history, use of contact lenses, and other factors. Follow your caregiver's recommendations for frequency of eye exams.  Eat a healthy diet. Foods like vegetables, fruits, whole grains, low-fat dairy products, and lean protein foods contain the nutrients you need without too many calories. Decrease your intake of foods high in solid fats, added sugars, and salt. Eat the right amount of calories for you.Get information about a proper diet from your caregiver, if necessary.  Regular physical exercise is one of the most important things you can do for your health. Most adults should get at least 150 minutes of moderate-intensity exercise (any activity that increases your heart rate and causes you to sweat) each week. In addition, most adults need muscle-strengthening exercises on 2 or more days a week.  Maintain a healthy weight. The body mass index (BMI) is a screening tool to identify possible weight problems. It provides an estimate of body fat based on height and weight. Your caregiver can help determine your BMI, and can help you achieve or maintain a healthy weight.For adults 20 years and older:  A BMI below 18.5 is considered underweight.  A BMI of 18.5 to 24.9 is normal.  A BMI of 25 to 29.9 is considered overweight.  A BMI of 30 and above is  considered obese.  Maintain normal blood lipids and cholesterol levels by exercising and minimizing your intake of saturated fat. Eat a balanced diet with plenty of fruit and vegetables. Blood tests for lipids and cholesterol should begin at age 20 and be repeated every 5 years. If your lipid or cholesterol levels are high, you are over 50, or you are at high risk for heart disease, you may need your cholesterol levels checked more frequently.Ongoing high lipid and cholesterol levels should be treated with medicines if diet and exercise are not effective.  If you smoke, find out from your caregiver how to quit. If you do not use tobacco, do not start.  If you are pregnant, do not drink alcohol. If you are breastfeeding, be very cautious about drinking alcohol. If you are not pregnant and choose to drink alcohol, do not exceed 1 drink per day. One drink is considered to be 12 ounces (355 mL) of beer, 5 ounces (148 mL) of wine, or 1.5 ounces (44 mL) of liquor.  Avoid use of street drugs. Do not share needles with anyone. Ask for help if you need support or instructions about stopping the use of drugs.  High blood pressure causes heart disease and increases the risk of stroke. Your blood pressure should be checked at least every 1 to 2 years. Ongoing high blood pressure should be treated with medicines if weight loss and exercise are not effective.  If you are 55 to 41 years old, ask your caregiver if you should take aspirin to prevent strokes.  Diabetes   screening involves taking a blood sample to check your fasting blood sugar level. This should be done once every 3 years, after age 45, if you are within normal weight and without risk factors for diabetes. Testing should be considered at a younger age or be carried out more frequently if you are overweight and have at least 1 risk factor for diabetes.  Breast cancer screening is essential preventive care for women. You should practice "breast  self-awareness." This means understanding the normal appearance and feel of your breasts and may include breast self-examination. Any changes detected, no matter how small, should be reported to a caregiver. Women in their 20s and 30s should have a clinical breast exam (CBE) by a caregiver as part of a regular health exam every 1 to 3 years. After age 40, women should have a CBE every year. Starting at age 40, women should consider having a mammography (breast X-ray test) every year. Women who have a family history of breast cancer should talk to their caregiver about genetic screening. Women at a high risk of breast cancer should talk to their caregivers about having magnetic resonance imaging (MRI) and a mammography every year.  The Pap test is a screening test for cervical cancer. A Pap test can show cell changes on the cervix that might become cervical cancer if left untreated. A Pap test is a procedure in which cells are obtained and examined from the lower end of the uterus (cervix).  Women should have a Pap test starting at age 21.  Between ages 21 and 29, Pap tests should be repeated every 2 years.  Beginning at age 30, you should have a Pap test every 3 years as long as the past 3 Pap tests have been normal.  Some women have medical problems that increase the chance of getting cervical cancer. Talk to your caregiver about these problems. It is especially important to talk to your caregiver if a new problem develops soon after your last Pap test. In these cases, your caregiver may recommend more frequent screening and Pap tests.  The above recommendations are the same for women who have or have not gotten the vaccine for human papillomavirus (HPV).  If you had a hysterectomy for a problem that was not cancer or a condition that could lead to cancer, then you no longer need Pap tests. Even if you no longer need a Pap test, a regular exam is a good idea to make sure no other problems are  starting.  If you are between ages 65 and 70, and you have had normal Pap tests going back 10 years, you no longer need Pap tests. Even if you no longer need a Pap test, a regular exam is a good idea to make sure no other problems are starting.  If you have had past treatment for cervical cancer or a condition that could lead to cancer, you need Pap tests and screening for cancer for at least 20 years after your treatment.  If Pap tests have been discontinued, risk factors (such as a new sexual partner) need to be reassessed to determine if screening should be resumed.  The HPV test is an additional test that may be used for cervical cancer screening. The HPV test looks for the virus that can cause the cell changes on the cervix. The cells collected during the Pap test can be tested for HPV. The HPV test could be used to screen women aged 30 years and older, and should   be used in women of any age who have unclear Pap test results. After the age of 30, women should have HPV testing at the same frequency as a Pap test.  Colorectal cancer can be detected and often prevented. Most routine colorectal cancer screening begins at the age of 50 and continues through age 75. However, your caregiver may recommend screening at an earlier age if you have risk factors for colon cancer. On a yearly basis, your caregiver may provide home test kits to check for hidden blood in the stool. Use of a small camera at the end of a tube, to directly examine the colon (sigmoidoscopy or colonoscopy), can detect the earliest forms of colorectal cancer. Talk to your caregiver about this at age 50, when routine screening begins. Direct examination of the colon should be repeated every 5 to 10 years through age 75, unless early forms of pre-cancerous polyps or small growths are found.  Hepatitis C blood testing is recommended for all people born from 1945 through 1965 and any individual with known risks for hepatitis C.  Practice  safe sex. Use condoms and avoid high-risk sexual practices to reduce the spread of sexually transmitted infections (STIs). STIs include gonorrhea, chlamydia, syphilis, trichomonas, herpes, HPV, and human immunodeficiency virus (HIV). Herpes, HIV, and HPV are viral illnesses that have no cure. They can result in disability, cancer, and death. Sexually active women aged 25 and younger should be checked for chlamydia. Older women with new or multiple partners should also be tested for chlamydia. Testing for other STIs is recommended if you are sexually active and at increased risk.  Osteoporosis is a disease in which the bones lose minerals and strength with aging. This can result in serious bone fractures. The risk of osteoporosis can be identified using a bone density scan. Women ages 65 and over and women at risk for fractures or osteoporosis should discuss screening with their caregivers. Ask your caregiver whether you should take a calcium supplement or vitamin D to reduce the rate of osteoporosis.  Menopause can be associated with physical symptoms and risks. Hormone replacement therapy is available to decrease symptoms and risks. You should talk to your caregiver about whether hormone replacement therapy is right for you.  Use sunscreen with sun protection factor (SPF) of 30 or more. Apply sunscreen liberally and repeatedly throughout the day. You should seek shade when your shadow is shorter than you. Protect yourself by wearing long sleeves, pants, a wide-brimmed hat, and sunglasses year round, whenever you are outdoors.  Once a month, do a whole body skin exam, using a mirror to look at the skin on your back. Notify your caregiver of new moles, moles that have irregular borders, moles that are larger than a pencil eraser, or moles that have changed in shape or color.  Stay current with required immunizations.  Influenza. You need a dose every fall (or winter). The composition of the flu vaccine  changes each year, so being vaccinated once is not enough.  Pneumococcal polysaccharide. You need 1 to 2 doses if you smoke cigarettes or if you have certain chronic medical conditions. You need 1 dose at age 65 (or older) if you have never been vaccinated.  Tetanus, diphtheria, pertussis (Tdap, Td). Get 1 dose of Tdap vaccine if you are younger than age 65, are over 65 and have contact with an infant, are a healthcare worker, are pregnant, or simply want to be protected from whooping cough. After that, you need a Td   booster dose every 10 years. Consult your caregiver if you have not had at least 3 tetanus and diphtheria-containing shots sometime in your life or have a deep or dirty wound.  HPV. You need this vaccine if you are a woman age 26 or younger. The vaccine is given in 3 doses over 6 months.  Measles, mumps, rubella (MMR). You need at least 1 dose of MMR if you were born in 1957 or later. You may also need a second dose.  Meningococcal. If you are age 19 to 21 and a first-year college student living in a residence hall, or have one of several medical conditions, you need to get vaccinated against meningococcal disease. You may also need additional booster doses.  Zoster (shingles). If you are age 60 or older, you should get this vaccine.  Varicella (chickenpox). If you have never had chickenpox or you were vaccinated but received only 1 dose, talk to your caregiver to find out if you need this vaccine.  Hepatitis A. You need this vaccine if you have a specific risk factor for hepatitis A virus infection or you simply wish to be protected from this disease. The vaccine is usually given as 2 doses, 6 to 18 months apart.  Hepatitis B. You need this vaccine if you have a specific risk factor for hepatitis B virus infection or you simply wish to be protected from this disease. The vaccine is given in 3 doses, usually over 6 months. Preventive Services / Frequency Ages 19 to 39  Blood  pressure check.** / Every 1 to 2 years.  Lipid and cholesterol check.** / Every 5 years beginning at age 20.  Clinical breast exam.** / Every 3 years for women in their 20s and 30s.  Pap test.** / Every 2 years from ages 21 through 29. Every 3 years starting at age 30 through age 65 or 70 with a history of 3 consecutive normal Pap tests.  HPV screening.** / Every 3 years from ages 30 through ages 65 to 70 with a history of 3 consecutive normal Pap tests.  Hepatitis C blood test.** / For any individual with known risks for hepatitis C.  Skin self-exam. / Monthly.  Influenza immunization.** / Every year.  Pneumococcal polysaccharide immunization.** / 1 to 2 doses if you smoke cigarettes or if you have certain chronic medical conditions.  Tetanus, diphtheria, pertussis (Tdap, Td) immunization. / A one-time dose of Tdap vaccine. After that, you need a Td booster dose every 10 years.  HPV immunization. / 3 doses over 6 months, if you are 26 and younger.  Measles, mumps, rubella (MMR) immunization. / You need at least 1 dose of MMR if you were born in 1957 or later. You may also need a second dose.  Meningococcal immunization. / 1 dose if you are age 19 to 21 and a first-year college student living in a residence hall, or have one of several medical conditions, you need to get vaccinated against meningococcal disease. You may also need additional booster doses.  Varicella immunization.** / Consult your caregiver.  Hepatitis A immunization.** / Consult your caregiver. 2 doses, 6 to 18 months apart.  Hepatitis B immunization.** / Consult your caregiver. 3 doses usually over 6 months. Ages 40 to 64  Blood pressure check.** / Every 1 to 2 years.  Lipid and cholesterol check.** / Every 5 years beginning at age 20.  Clinical breast exam.** / Every year after age 40.  Mammogram.** / Every year beginning at age 40   and continuing for as long as you are in good health. Consult with your  caregiver.  Pap test.** / Every 3 years starting at age 30 through age 65 or 70 with a history of 3 consecutive normal Pap tests.  HPV screening.** / Every 3 years from ages 30 through ages 65 to 70 with a history of 3 consecutive normal Pap tests.  Fecal occult blood test (FOBT) of stool. / Every year beginning at age 50 and continuing until age 75. You may not need to do this test if you get a colonoscopy every 10 years.  Flexible sigmoidoscopy or colonoscopy.** / Every 5 years for a flexible sigmoidoscopy or every 10 years for a colonoscopy beginning at age 50 and continuing until age 75.  Hepatitis C blood test.** / For all people born from 1945 through 1965 and any individual with known risks for hepatitis C.  Skin self-exam. / Monthly.  Influenza immunization.** / Every year.  Pneumococcal polysaccharide immunization.** / 1 to 2 doses if you smoke cigarettes or if you have certain chronic medical conditions.  Tetanus, diphtheria, pertussis (Tdap, Td) immunization.** / A one-time dose of Tdap vaccine. After that, you need a Td booster dose every 10 years.  Measles, mumps, rubella (MMR) immunization. / You need at least 1 dose of MMR if you were born in 1957 or later. You may also need a second dose.  Varicella immunization.** / Consult your caregiver.  Meningococcal immunization.** / Consult your caregiver.  Hepatitis A immunization.** / Consult your caregiver. 2 doses, 6 to 18 months apart.  Hepatitis B immunization.** / Consult your caregiver. 3 doses, usually over 6 months. Ages 65 and over  Blood pressure check.** / Every 1 to 2 years.  Lipid and cholesterol check.** / Every 5 years beginning at age 20.  Clinical breast exam.** / Every year after age 40.  Mammogram.** / Every year beginning at age 40 and continuing for as long as you are in good health. Consult with your caregiver.  Pap test.** / Every 3 years starting at age 30 through age 65 or 70 with a 3  consecutive normal Pap tests. Testing can be stopped between 65 and 70 with 3 consecutive normal Pap tests and no abnormal Pap or HPV tests in the past 10 years.  HPV screening.** / Every 3 years from ages 30 through ages 65 or 70 with a history of 3 consecutive normal Pap tests. Testing can be stopped between 65 and 70 with 3 consecutive normal Pap tests and no abnormal Pap or HPV tests in the past 10 years.  Fecal occult blood test (FOBT) of stool. / Every year beginning at age 50 and continuing until age 75. You may not need to do this test if you get a colonoscopy every 10 years.  Flexible sigmoidoscopy or colonoscopy.** / Every 5 years for a flexible sigmoidoscopy or every 10 years for a colonoscopy beginning at age 50 and continuing until age 75.  Hepatitis C blood test.** / For all people born from 1945 through 1965 and any individual with known risks for hepatitis C.  Osteoporosis screening.** / A one-time screening for women ages 65 and over and women at risk for fractures or osteoporosis.  Skin self-exam. / Monthly.  Influenza immunization.** / Every year.  Pneumococcal polysaccharide immunization.** / 1 dose at age 65 (or older) if you have never been vaccinated.  Tetanus, diphtheria, pertussis (Tdap, Td) immunization. / A one-time dose of Tdap vaccine if you are over   65 and have contact with an infant, are a healthcare worker, or simply want to be protected from whooping cough. After that, you need a Td booster dose every 10 years.  Varicella immunization.** / Consult your caregiver.  Meningococcal immunization.** / Consult your caregiver.  Hepatitis A immunization.** / Consult your caregiver. 2 doses, 6 to 18 months apart.  Hepatitis B immunization.** / Check with your caregiver. 3 doses, usually over 6 months. ** Family history and personal history of risk and conditions may change your caregiver's recommendations. Document Released: 07/25/2001 Document Revised: 08/21/2011  Document Reviewed: 10/24/2010 [employer] Patient Information 2014 [employer].  

## 2012-12-24 LAB — URINE CULTURE: Colony Count: NO GROWTH

## 2013-01-17 ENCOUNTER — Encounter: Payer: Self-pay | Admitting: Family Medicine

## 2013-02-23 ENCOUNTER — Other Ambulatory Visit: Payer: Self-pay | Admitting: Family Medicine

## 2013-02-24 NOTE — Telephone Encounter (Signed)
Last seen 12/23/12 and filled 12/13/12 #60. Please advise      KP

## 2013-04-17 ENCOUNTER — Other Ambulatory Visit: Payer: Self-pay

## 2013-04-25 ENCOUNTER — Other Ambulatory Visit: Payer: Self-pay | Admitting: Family Medicine

## 2013-04-25 NOTE — Telephone Encounter (Signed)
Last seen 12/23/12 and filled 02/23/13 #60.     KP

## 2013-07-10 ENCOUNTER — Ambulatory Visit (INDEPENDENT_AMBULATORY_CARE_PROVIDER_SITE_OTHER): Payer: BC Managed Care – PPO | Admitting: Family Medicine

## 2013-07-10 ENCOUNTER — Encounter: Payer: Self-pay | Admitting: Family Medicine

## 2013-07-10 VITALS — BP 118/72 | HR 72 | Temp 98.6°F | Resp 16 | Wt 129.0 lb

## 2013-07-10 DIAGNOSIS — R6884 Jaw pain: Secondary | ICD-10-CM | POA: Insufficient documentation

## 2013-07-10 MED ORDER — NAPROXEN 500 MG PO TABS
500.0000 mg | ORAL_TABLET | Freq: Two times a day (BID) | ORAL | Status: DC
Start: 2013-07-10 — End: 2013-10-30

## 2013-07-10 NOTE — Progress Notes (Signed)
   Subjective:    Patient ID: Lynn Olsen, female    DOB: 18-Oct-1970, 43 y.o.   MRN: [id number]  HPI Facial pain- sxs started on Sunday.  Initially thought it was jaw or tooth pain.  Now pain has moved into ear.  R sided.  Taking tylenol and sudafed w/ some improvement.  No fevers.  No congestion or drainage.  No hx of dental problems.  No pain w/ chewing.  sxs worse at night.  Admits to grinding teeth- is in discussions w/ dentist to address this.   Review of Systems For ROS see HPI     Objective:   Physical Exam  Vitals reviewed. Constitutional: She appears well-developed and well-nourished. No distress.  HENT:  Head: Normocephalic and atraumatic.  Right Ear: External ear normal.  Mouth/Throat: Oropharynx is clear and moist. No oropharyngeal exudate.  No TTP over R masseter No dental carries visible No TTP over sinuses          Assessment & Plan:

## 2013-07-10 NOTE — Patient Instructions (Signed)
Follow up as needed I suspect this is from grinding/clenching your teeth Start the Naproxen twice daily- w/ food- for 5 days and then as needed Consider going forward w/ the bite guard Call with any questions or concerns Hang in there!!!

## 2013-07-10 NOTE — Progress Notes (Signed)
Pre-visit discussion using our clinic review tool, as applicable. No additional management support is needed unless otherwise documented below in the visit note.

## 2013-07-13 NOTE — Assessment & Plan Note (Signed)
New.  No evidence of tooth abscess, sinus infxn, or ear infxn.  Suspect this is due to pt clenching/grinding her teeth.  Start scheduled NSAIDs.  Pt to followup w/ dentist for oral appliance.  Pt expressed understanding and is in agreement w/ plan.

## 2013-10-30 ENCOUNTER — Encounter: Payer: Self-pay | Admitting: Family Medicine

## 2013-10-30 ENCOUNTER — Ambulatory Visit (INDEPENDENT_AMBULATORY_CARE_PROVIDER_SITE_OTHER): Payer: BC Managed Care – PPO | Admitting: Family Medicine

## 2013-10-30 VITALS — BP 116/78 | HR 81 | Temp 98.4°F | Wt 128.0 lb

## 2013-10-30 DIAGNOSIS — R635 Abnormal weight gain: Secondary | ICD-10-CM

## 2013-10-30 DIAGNOSIS — F32A Depression, unspecified: Secondary | ICD-10-CM

## 2013-10-30 DIAGNOSIS — F3289 Other specified depressive episodes: Secondary | ICD-10-CM

## 2013-10-30 DIAGNOSIS — F329 Major depressive disorder, single episode, unspecified: Secondary | ICD-10-CM

## 2013-10-30 MED ORDER — BUPROPION HCL ER (XL) 150 MG PO TB24: ORAL_TABLET | ORAL | Status: DC

## 2013-10-30 NOTE — Progress Notes (Signed)
Pre visit review using our clinic review tool, if applicable. No additional management support is needed unless otherwise documented below in the visit note. 

## 2013-10-30 NOTE — Patient Instructions (Signed)
Exercise to Lose Weight Exercise and a healthy diet may help you lose weight. Your doctor may suggest specific exercises. EXERCISE IDEAS AND TIPS  Choose low-cost things you enjoy doing, such as walking, bicycling, or exercising to workout videos.  Take stairs instead of the elevator.  Walk during your lunch break.  Park your car further away from work or school.  Go to a gym or an exercise class.  Start with 5 to 10 minutes of exercise each day. Build up to 30 minutes of exercise 4 to 6 days a week.  Wear shoes with good support and comfortable clothes.  Stretch before and after working out.  Work out until you breathe harder and your heart beats faster.  Drink extra water when you exercise.  Do not do so much that you hurt yourself, feel dizzy, or get very short of breath. Exercises that burn about 150 calories:  Running 1  miles in 15 minutes.  Playing volleyball for 45 to 60 minutes.  Washing and waxing a car for 45 to 60 minutes.  Playing touch football for 45 minutes.  Walking 1  miles in 35 minutes.  Pushing a stroller 1  miles in 30 minutes.  Playing basketball for 30 minutes.  Raking leaves for 30 minutes.  Bicycling 5 miles in 30 minutes.  Walking 2 miles in 30 minutes.  Dancing for 30 minutes.  Shoveling snow for 15 minutes.  Swimming laps for 20 minutes.  Walking up stairs for 15 minutes.  Bicycling 4 miles in 15 minutes.  Gardening for 30 to 45 minutes.  Jumping rope for 15 minutes.  Washing windows or floors for 45 to 60 minutes. Document Released: 07/01/2010 Document Revised: 08/21/2011 Document Reviewed: 07/01/2010 [facility] Patient Information 2014 [place], [place].

## 2013-10-30 NOTE — Progress Notes (Signed)
   Subjective:    Patient ID: Lynn Olsen, female    DOB: 18-Nov-1970, 43 y.o.   MRN: [id number]  HPI Pt is here c/o weight gain / bloating.  She is dieting and exercising.  Bloating comes and goes.  She states there has been no change in her diet and she is exercising more but gaining weight.     Review of Systems As above    Objective:   Physical Exam BP 116/78  Pulse 81  Temp(Src) 98.4 F (36.9 C) (Oral)  Wt 128 lb (58.06 kg)  SpO2 95% General appearance: alert, cooperative, appears stated age and no distress Nose: Nares normal. Septum midline. Mucosa normal. No drainage or sinus tenderness. Throat: lips, mucosa, and tongue normal; teeth and gums normal Neck: no adenopathy, no carotid bruit, no JVD, supple, symmetrical, trachea midline and thyroid not enlarged, symmetric, no tenderness/mass/nodules Lungs: clear to auscultation bilaterally Heart: regular rate and rhythm, S1, S2 normal, no murmur, click, rub or gallop Extremities: extremities normal, atraumatic, no cyanosis or edema        Assessment & Plan:  1. Weight gain Check labs,  Discussed diet and exercise - TSH - T3, free - T4, free - Basic metabolic panel - CBC with Differential  2. Depression  - buPROPion (WELLBUTRIN XL) 150 MG 24 hr tablet; 1 po qd for 1 week then 2 po qd  Dispense: 60 tablet; Refill: 0

## 2013-10-31 LAB — BASIC METABOLIC PANEL
BUN: 10 mg/dL (ref 6–23)
CO2: 27 mEq/L (ref 19–32)
Calcium: 9 mg/dL (ref 8.4–10.5)
Chloride: 104 mEq/L (ref 96–112)
Creatinine, Ser: 0.7 mg/dL (ref 0.4–1.2)
GFR: 100.93 mL/min (ref >60.00)
Glucose, Bld: 102 mg/dL — ABNORMAL HIGH (ref 70–99)
POTASSIUM: 3.9 meq/L (ref 3.5–5.1)
Sodium: 138 mEq/L (ref 135–145)

## 2013-10-31 LAB — T3, FREE: T3, Free: 3.1 pg/mL (ref 2.3–4.2)

## 2013-10-31 LAB — CBC WITH DIFFERENTIAL/PLATELET
Basophils Absolute: 0 10*3/uL (ref 0.0–0.1)
Basophils Relative: 0.6 % (ref 0.0–3.0)
EOS PCT: 3.1 % (ref 0.0–5.0)
Eosinophils Absolute: 0.2 10*3/uL (ref 0.0–0.7)
HEMATOCRIT: 39.3 % (ref 36.0–46.0)
HEMOGLOBIN: 13.3 g/dL (ref 12.0–15.0)
LYMPHS ABS: 1.8 10*3/uL (ref 0.7–4.0)
Lymphocytes Relative: 29.3 % (ref 12.0–46.0)
MCHC: 33.8 g/dL (ref 30.0–36.0)
MCV: 94.1 fl (ref 78.0–100.0)
MONO ABS: 0.4 10*3/uL (ref 0.1–1.0)
Monocytes Relative: 6.9 % (ref 3.0–12.0)
NEUTROS ABS: 3.6 10*3/uL (ref 1.4–7.7)
Neutrophils Relative %: 60.1 % (ref 43.0–77.0)
Platelets: 244 10*3/uL (ref 150.0–400.0)
RBC: 4.18 Mil/uL (ref 3.87–5.11)
RDW: 13.4 % (ref 11.5–15.5)
WBC: 6 10*3/uL (ref 4.0–10.5)

## 2013-10-31 LAB — T4, FREE: Free T4: 0.79 ng/dL (ref 0.60–1.60)

## 2013-10-31 LAB — TSH: TSH: 1.04 u[IU]/mL (ref 0.35–4.50)

## 2013-11-04 ENCOUNTER — Encounter: Payer: Self-pay | Admitting: Family Medicine

## 2013-11-30 ENCOUNTER — Other Ambulatory Visit: Payer: Self-pay | Admitting: Family Medicine

## 2014-03-27 ENCOUNTER — Other Ambulatory Visit: Payer: Self-pay

## 2016-10-31 ENCOUNTER — Ambulatory Visit (INDEPENDENT_AMBULATORY_CARE_PROVIDER_SITE_OTHER): Payer: BLUE CROSS/BLUE SHIELD | Admitting: Family Medicine

## 2016-10-31 ENCOUNTER — Encounter: Payer: Self-pay | Admitting: Family Medicine

## 2016-10-31 VITALS — BP 110/78 | HR 94 | Temp 98.9°F | Resp 16 | Ht 62.0 in | Wt 135.4 lb

## 2016-10-31 DIAGNOSIS — M79662 Pain in left lower leg: Secondary | ICD-10-CM

## 2016-10-31 NOTE — Patient Instructions (Signed)
We are referring you to Dr Barbaraann Barthel --sports medicine Someone from our office will call you in the morning

## 2016-10-31 NOTE — Progress Notes (Signed)
Patient ID: Lynn Olsen, female   DOB: 07-19-1970, 46 y.o.   MRN: [id number]     Subjective:  I acted as a Education administrator for Dr. Carollee Olsen.  Lynn Olsen, Lynn Olsen   Patient ID: Lynn Olsen, female    DOB: [birth year], 46 y.o.   MRN: [id number]  Chief Complaint  Patient presents with  . Leg Pain    left leg    Leg Pain   Incident onset: 3 months. There was no injury mechanism. The pain is present in the left leg. The pain has been intermittent since onset. Associated symptoms include an inability to bear weight. Pertinent negatives include no muscle weakness, numbness or tingling. The symptoms are aggravated by movement and weight bearing (at rest). She has tried NSAIDs (takes when really bad) for the symptoms.    Patient is in today for left leg pain.  Patient Care Team: Lynn Olsen, Lynn Apa, DO as PCP - General   Past Medical History:  Diagnosis Date  . Kidney stone    litotrip. 2008  . Migraines     No past surgical history on file.  Family History  Problem Relation Age of Onset  . Alzheimer's disease Father   . Heart disease Maternal Uncle 19       MI    Social History   Social History  . Marital status: Divorced    Spouse name: N/A  . Number of children: N/A  . Years of education: N/A   Occupational History  . pt-- [employer]    Social History Main Topics  . Smoking status: Never Smoker  . Smokeless tobacco: Never Used  . Alcohol use Yes     Comment: socially   . Drug use: No  . Sexual activity: Not Currently    Partners: Male   Other Topics Concern  . Not on file   Social History Narrative   Exercise-- 5x a week    Outpatient Medications Prior to Visit  Medication Sig Dispense Refill  . LOW-OGESTREL 0.3-30 MG-MCG tablet     . buPROPion (WELLBUTRIN XL) 150 MG 24 hr tablet Take 2 tablets (300 mg total) by mouth daily. 60 tablet 5   No facility-administered medications prior to visit.     Allergies  Allergen Reactions  . Butorphanol Tartrate       REACTION: DIFFICULITY BREATHING    Review of Systems  Constitutional: Negative for fever and malaise/fatigue.  HENT: Negative for congestion.   Eyes: Negative for blurred vision.  Respiratory: Negative for cough and shortness of breath.   Cardiovascular: Negative for chest pain, palpitations and leg swelling.  Gastrointestinal: Negative for vomiting.  Musculoskeletal: Negative for back pain.  Skin: Negative for rash.  Neurological: Negative for tingling, loss of consciousness, numbness and headaches.       Objective:    Physical Exam  Constitutional: She is oriented to person, place, and time. She appears well-developed and well-nourished. No distress.  HENT:  Head: Normocephalic and atraumatic.  Eyes: Conjunctivae are normal.  Neck: Normal range of motion. No thyromegaly present.  Cardiovascular: Normal rate and regular rhythm.   Pulmonary/Chest: Effort normal and breath sounds normal. She has no wheezes.  Abdominal: Soft. Bowel sounds are normal. There is no tenderness.  Musculoskeletal: Normal range of motion. She exhibits no edema or deformity.  Neurological: She is alert and oriented to person, place, and time.  Skin: Skin is warm and dry. She is not diaphoretic.  Psychiatric: She has a normal mood and affect.  BP 110/78 (BP Location: Left Arm, Cuff Size: Normal)   Pulse 94   Temp 98.9 F (37.2 C) (Oral)   Resp 16   Ht 5' 2"  (1.575 m)   Wt 135 lb 6.4 oz (61.4 kg)   SpO2 98%   BMI 24.76 kg/m  Wt Readings from Last 3 Encounters:  11/01/16 135 lb (61.2 kg)  10/31/16 135 lb 6.4 oz (61.4 kg)  10/30/13 128 lb (58.1 kg)   BP Readings from Last 3 Encounters:  11/01/16 119/84  10/31/16 110/78  10/30/13 116/78     Immunization History  Administered Date(s) Administered  . PPD Test 07/05/2012    Health Maintenance  Topic Date Due  . HIV Screening  12/26/1986  . MAMMOGRAM  12/25/1989  . TETANUS/TDAP  12/26/1990  . PAP SMEAR  11/25/2015  . INFLUENZA  VACCINE  01/10/2017    Lab Results  Component Value Date   WBC 6.0 10/30/2013   HGB 13.3 10/30/2013   HCT 39.3 10/30/2013   PLT 244.0 10/30/2013   GLUCOSE 102 (H) 10/30/2013   CHOL 209 (H) 12/23/2012   TRIG 88.0 12/23/2012   HDL 63.80 12/23/2012   LDLDIRECT 137.0 12/23/2012   ALT 14 12/23/2012   AST 19 12/23/2012   NA 138 10/30/2013   K 3.9 10/30/2013   CL 104 10/30/2013   CREATININE 0.7 10/30/2013   BUN 10 10/30/2013   CO2 27 10/30/2013   TSH 1.04 10/30/2013    Lab Results  Component Value Date   TSH 1.04 10/30/2013   Lab Results  Component Value Date   WBC 6.0 10/30/2013   HGB 13.3 10/30/2013   HCT 39.3 10/30/2013   MCV 94.1 10/30/2013   PLT 244.0 10/30/2013   Lab Results  Component Value Date   NA 138 10/30/2013   K 3.9 10/30/2013   CO2 27 10/30/2013   GLUCOSE 102 (H) 10/30/2013   BUN 10 10/30/2013   CREATININE 0.7 10/30/2013   BILITOT 0.3 12/23/2012   ALKPHOS 31 (L) 12/23/2012   AST 19 12/23/2012   ALT 14 12/23/2012   PROT 7.7 12/23/2012   ALBUMIN 4.0 12/23/2012   CALCIUM 9.0 10/30/2013   GFR 100.93 10/30/2013   Lab Results  Component Value Date   CHOL 209 (H) 12/23/2012   Lab Results  Component Value Date   HDL 63.80 12/23/2012   No results found for: [facility] Lab Results  Component Value Date   TRIG 88.0 12/23/2012   Lab Results  Component Value Date   CHOLHDL 3 12/23/2012   No results found for: HGBA1C       Assessment & Plan:   Problem List Items Addressed This Visit    None    Visit Diagnoses    Pain of left lower leg    -  Primary   Relevant Orders   Ambulatory referral to Sports Medicine   DG Tibia/Fibula Left      I have discontinued Lynn Olsen's buPROPion. I am also having her maintain her LOW-OGESTREL.  No orders of the defined types were placed in this encounter.   CMA served as Education administrator during this visit. History, Physical and Plan performed by medical provider. Documentation and orders reviewed and attested to.   Ann Held, DO

## 2016-11-01 ENCOUNTER — Ambulatory Visit (HOSPITAL_BASED_OUTPATIENT_CLINIC_OR_DEPARTMENT_OTHER)
Admission: RE | Admit: 2016-11-01 | Discharge: 2016-11-01 | Disposition: A | Discharge status: Home / Self Care | Payer: BLUE CROSS/BLUE SHIELD | Source: Ambulatory Visit | Attending: Family Medicine | Admitting: Family Medicine

## 2016-11-01 ENCOUNTER — Ambulatory Visit (INDEPENDENT_AMBULATORY_CARE_PROVIDER_SITE_OTHER): Payer: BLUE CROSS/BLUE SHIELD | Admitting: Family Medicine

## 2016-11-01 ENCOUNTER — Encounter: Payer: Self-pay | Admitting: Family Medicine

## 2016-11-01 DIAGNOSIS — M84362A Stress fracture, left tibia, initial encounter for fracture: Secondary | ICD-10-CM | POA: Diagnosis not present

## 2016-11-01 DIAGNOSIS — M79662 Pain in left lower leg: Secondary | ICD-10-CM | POA: Insufficient documentation

## 2016-11-01 NOTE — Patient Instructions (Signed)
You have a distal tibia stress fracture. Ice the area 15 minutes at a time 3-4 times a day. Tylenol 551m 1-2 tabs three times a day as needed for pain. Wear aircast when up and walking around. Crutches with only touch down weight bearing for next 2 weeks. Follow up with me at that time.

## 2016-11-06 DIAGNOSIS — M84362A Stress fracture, left tibia, initial encounter for fracture: Secondary | ICD-10-CM | POA: Insufficient documentation

## 2016-11-06 NOTE — Assessment & Plan Note (Signed)
independently reviewed radiographs and no evidence abnormalities.  MSK u/s performed showing tibial stress fracture in area of pain.  Long aircast to wear when up and walking around.  Icing,tylenol as needed.  Crutches with only touch down weight bearing.  F/u in 2 weeks.

## 2016-11-06 NOTE — Progress Notes (Signed)
PCP and consultation requested by: Ann Held, DO  Subjective:   HPI: Patient is a 46 y.o. female here for left leg pain.  Patient reports she's had 3 months of left lower leg pain. She does not run for exercise. Pain has become worse over those 3 months. Pain is medial, sharp. Worse with walking and at nighttime. No injury or trauma. No swelling or bruising. Tried motrin. No skin changes, numbness. Pain level 1/10 at rest but up to 8/10 at worst.  Past Medical History:  Diagnosis Date  . Kidney stone    litotrip. 2008  . Migraines     Current Outpatient Prescriptions on File Prior to Visit  Medication Sig Dispense Refill  . LOW-OGESTREL 0.3-30 MG-MCG tablet      No current facility-administered medications on file prior to visit.     No past surgical history on file.  Allergies  Allergen Reactions  . Butorphanol Tartrate     REACTION: DIFFICULITY BREATHING    Social History   Social History  . Marital status: Divorced    Spouse name: N/A  . Number of children: N/A  . Years of education: N/A   Occupational History  . pt-- [employer]    Social History Main Topics  . Smoking status: Never Smoker  . Smokeless tobacco: Never Used  . Alcohol use Yes     Comment: socially   . Drug use: No  . Sexual activity: Not Currently    Partners: Male   Other Topics Concern  . Not on file   Social History Narrative   Exercise-- 5x a week    Family History  Problem Relation Age of Onset  . Alzheimer's disease Father   . Heart disease Maternal Uncle 45       MI    BP 119/84   Pulse 81   Ht 5' 2"  (1.575 m)   Wt 135 lb (61.2 kg)   BMI 24.69 kg/m   Review of Systems: See HPI above.     Objective:  Physical Exam:  Gen: NAD, comfortable in exam room  Left leg: No gross deformity, swelling, bruising. TTP medial tibia between mid and distal 1/3rd.  No other lower leg tenderness. FROM knee and ankle without pain. 5/5 strength all motions  without reproduction of pain. NVI distally.  Right leg: FROM knee and ankle without pain.  MSK u/s left leg:  Cortical irregularity in area of pain of tibia with edema overlying cortex and increased neovascularity.  No other abnormalities.   Assessment & Plan:  1. Left tibia stress fracture - independently reviewed radiographs and no evidence abnormalities.  MSK u/s performed showing tibial stress fracture in area of pain.  Long aircast to wear when up and walking around.  Icing,tylenol as needed.  Crutches with only touch down weight bearing.  F/u in 2 weeks.

## 2016-11-15 ENCOUNTER — Encounter: Payer: Self-pay | Admitting: Family Medicine

## 2016-11-15 ENCOUNTER — Ambulatory Visit (INDEPENDENT_AMBULATORY_CARE_PROVIDER_SITE_OTHER): Payer: BLUE CROSS/BLUE SHIELD | Admitting: Family Medicine

## 2016-11-15 VITALS — BP 126/88 | HR 109 | Ht 62.0 in | Wt 130.0 lb

## 2016-11-15 DIAGNOSIS — M84362A Stress fracture, left tibia, initial encounter for fracture: Secondary | ICD-10-CM | POA: Diagnosis not present

## 2016-11-15 NOTE — Patient Instructions (Signed)
You have a distal tibia stress fracture. Ice the area 15 minutes at a time 3-4 times a day. Tylenol 531m 1-2 tabs three times a day as needed for pain. Wear aircast when up and walking around. Walk around the house in the aircast without crutches before you try this at work. I would still keep a walker or the crutches with you in the car or at work the next few days in case you develop soreness. We will set up a bone density test for you. Ok for low resistance cycling, swimming if these do not worsen your pain Follow up with me in 2 weeks.

## 2016-11-16 NOTE — Assessment & Plan Note (Signed)
Clinically improving 2 weeks out from diagnosis.  Transition to using long aircast and bearing weight - start around house first.  Tylenol, icing only if needed.  Will arrange for DEXA also.  Cycling, swimming ok if no pain with these.  F/u in 2 weeks.

## 2016-11-16 NOTE — Progress Notes (Signed)
PCP and consultation requested by: Ann Held, DO  Subjective:   HPI: Patient is a 46 y.o. female here for left leg pain.  5/23: Patient reports she's had 3 months of left lower leg pain. She does not run for exercise. Pain has become worse over those 3 months. Pain is medial, sharp. Worse with walking and at nighttime. No injury or trauma. No swelling or bruising. Tried motrin. No skin changes, numbness. Pain level 1/10 at rest but up to 8/10 at worst.  6/6: Patient reports she is doing well. Pain level now 0/10. Using aircast and crutches with only touch down weight bearing. Not needing to ice or take tylenol. Uses a walker at work. No skin changes, numbness.  Past Medical History:  Diagnosis Date  . Kidney stone    litotrip. 2008  . Migraines     Current Outpatient Prescriptions on File Prior to Visit  Medication Sig Dispense Refill  . LOW-OGESTREL 0.3-30 MG-MCG tablet      No current facility-administered medications on file prior to visit.     No past surgical history on file.  Allergies  Allergen Reactions  . Butorphanol Tartrate     REACTION: DIFFICULITY BREATHING    Social History   Social History  . Marital status: Divorced    Spouse name: N/A  . Number of children: N/A  . Years of education: N/A   Occupational History  . pt-- [employer]    Social History Main Topics  . Smoking status: Never Smoker  . Smokeless tobacco: Never Used  . Alcohol use Yes     Comment: socially   . Drug use: No  . Sexual activity: Not Currently    Partners: Male   Other Topics Concern  . Not on file   Social History Narrative   Exercise-- 5x a week    Family History  Problem Relation Age of Onset  . Alzheimer's disease Father   . Heart disease Maternal Uncle 45       MI    BP 126/88   Pulse (!) 109   Ht 5' 2"  (1.575 m)   Wt 130 lb (59 kg)   BMI 23.78 kg/m   Review of Systems: See HPI above.     Objective:  Physical  Exam:  Gen: NAD, comfortable in exam room  Left leg: No gross deformity, swelling, bruising. Mild TTP medial tibia between mid and distal 1/3rd, improved.  No other lower leg tenderness. FROM knee and ankle without pain. 5/5 strength all motions without reproduction of pain. NVI distally.  Right leg: FROM knee and ankle without pain.  MSK u/s left leg:  Cortical irregularity in area of pain of tibia with less edema and neovascularity now.   Assessment & Plan:  1. Left tibia stress fracture - Clinically improving 2 weeks out from diagnosis.  Transition to using long aircast and bearing weight - start around house first.  Tylenol, icing only if needed.  Will arrange for DEXA also.  Cycling, swimming ok if no pain with these.  F/u in 2 weeks.

## 2016-11-20 ENCOUNTER — Telehealth: Payer: Self-pay | Admitting: Family Medicine

## 2016-11-20 NOTE — Telephone Encounter (Signed)
Patient states she was supposed to have a bone density test but she has not heard any updates

## 2016-11-21 NOTE — Telephone Encounter (Signed)
Spoke to the radiology department. Patient has been scheduled.

## 2016-11-28 ENCOUNTER — Ambulatory Visit (INDEPENDENT_AMBULATORY_CARE_PROVIDER_SITE_OTHER): Payer: BLUE CROSS/BLUE SHIELD | Admitting: Family Medicine

## 2016-11-28 ENCOUNTER — Ambulatory Visit (HOSPITAL_BASED_OUTPATIENT_CLINIC_OR_DEPARTMENT_OTHER)
Admission: RE | Admit: 2016-11-28 | Discharge: 2016-11-28 | Disposition: A | Discharge status: Home / Self Care | Payer: BLUE CROSS/BLUE SHIELD | Source: Ambulatory Visit | Attending: Family Medicine | Admitting: Family Medicine

## 2016-11-28 DIAGNOSIS — M84362D Stress fracture, left tibia, subsequent encounter for fracture with routine healing: Secondary | ICD-10-CM | POA: Diagnosis not present

## 2016-11-28 DIAGNOSIS — M84362A Stress fracture, left tibia, initial encounter for fracture: Secondary | ICD-10-CM

## 2016-11-28 NOTE — Patient Instructions (Signed)
You have a distal tibia stress fracture. Wear aircast when up and walking around for 2 more weeks. After this only wear the aircast when exercises for the next 2 weeks. Follow up with me in 4 weeks. Icing, tylenol only if needed. The bone density results aren't back yet - I'll call you (or Nevin Bloodgood will) when we have the results and if we need to do any additional treatment.

## 2016-11-29 ENCOUNTER — Encounter: Payer: Self-pay | Admitting: Family Medicine

## 2016-12-01 NOTE — Progress Notes (Signed)
PCP and consultation requested by: Ann Held, DO  Subjective:   HPI: Patient is a 46 y.o. female here for left leg pain.  5/23: Patient reports she's had 3 months of left lower leg pain. She does not run for exercise. Pain has become worse over those 3 months. Pain is medial, sharp. Worse with walking and at nighttime. No injury or trauma. No swelling or bruising. Tried motrin. No skin changes, numbness. Pain level 1/10 at rest but up to 8/10 at worst.  6/6: Patient reports she is doing well. Pain level now 0/10. Using aircast and crutches with only touch down weight bearing. Not needing to ice or take tylenol. Uses a walker at work. No skin changes, numbness.  6/19: Patient reports she's doing well - some pain today as has been on leg a lot, more walking. Pain up to 2/10, dull lower leg. No skin changes, numbness.  Past Medical History:  Diagnosis Date  . Kidney stone    litotrip. 2008  . Migraines     Current Outpatient Prescriptions on File Prior to Visit  Medication Sig Dispense Refill  . LOW-OGESTREL 0.3-30 MG-MCG tablet      No current facility-administered medications on file prior to visit.     No past surgical history on file.  Allergies  Allergen Reactions  . Butorphanol Tartrate     REACTION: DIFFICULITY BREATHING    Social History   Social History  . Marital status: Divorced    Spouse name: N/A  . Number of children: N/A  . Years of education: N/A   Occupational History  . pt-- [employer]    Social History Main Topics  . Smoking status: Never Smoker  . Smokeless tobacco: Never Used  . Alcohol use Yes     Comment: socially   . Drug use: No  . Sexual activity: Not Currently    Partners: Male   Other Topics Concern  . Not on file   Social History Narrative   Exercise-- 5x a week    Family History  Problem Relation Age of Onset  . Alzheimer's disease Father   . Heart disease Maternal Uncle 45       MI    BP  117/81   Pulse 92   Ht 5' 2"  (1.575 m)   Wt 134 lb (60.8 kg)   BMI 24.51 kg/m   Review of Systems: See HPI above.     Objective:  Physical Exam:  Gen: NAD, comfortable in exam room  Left leg: No gross deformity, swelling, bruising. No TTP medial tibia between mid and distal 1/3rd, improved.  No other lower leg tenderness. FROM knee and ankle without pain. 5/5 strength all motions without reproduction of pain. NVI distally. Negative hop and fulcrum tests.  Right leg: FROM knee and ankle without pain.   Assessment & Plan:  1. Left tibia stress fracture - Clinically improving though some soreness today.  4 weeks out now - encouraged to wear aircast with walking for 2 more weeks then only with exercise for 2 more weeks.  Icing, tylenol if needed.  F/u in 4 weeks.  Bone density test was normal for age - left message with patient to take calcium 1353m, vitamin D 800 IU daily but no additional recommendations.

## 2016-12-01 NOTE — Assessment & Plan Note (Signed)
Clinically improving though some soreness today.  4 weeks out now - encouraged to wear aircast with walking for 2 more weeks then only with exercise for 2 more weeks.  Icing, tylenol if needed.  F/u in 4 weeks.  Bone density test was normal for age - left message with patient to take calcium 1314m, vitamin D 800 IU daily but no additional recommendations.
